# Patient Record
Sex: Female | Born: 2000 | Race: Black or African American | Hispanic: No | Marital: Single | Smoking: Never smoker
Health system: Southern US, Community
[De-identification: ages and names within clinical notes are randomized; demographics above are authoritative.]

## PROBLEM LIST (undated history)

## (undated) DIAGNOSIS — F32A Depression, unspecified: Secondary | ICD-10-CM

## (undated) DIAGNOSIS — G43909 Migraine, unspecified, not intractable, without status migrainosus: Secondary | ICD-10-CM

---
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
## 2022-02-06 IMAGING — DX DG FINGER LITTLE 2+V*R*
3 series · 3 of 3 positions shown · non-contrast
Comparison: None

CLINICAL DATA: Pain swelling RIGHT little finger, jammed finger
playing basketball today, pain at PIP joint

EXAM:
RIGHT LITTLE FINGER 2+V

[finger pa]
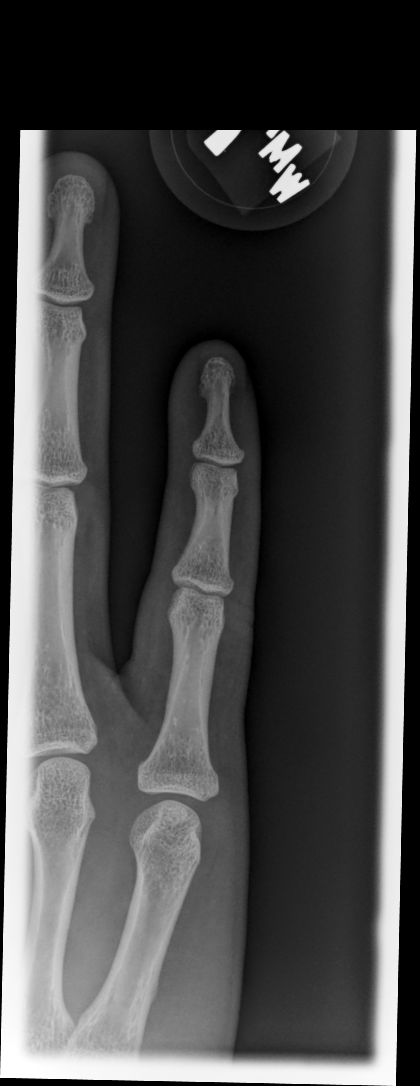

[finger mlo]
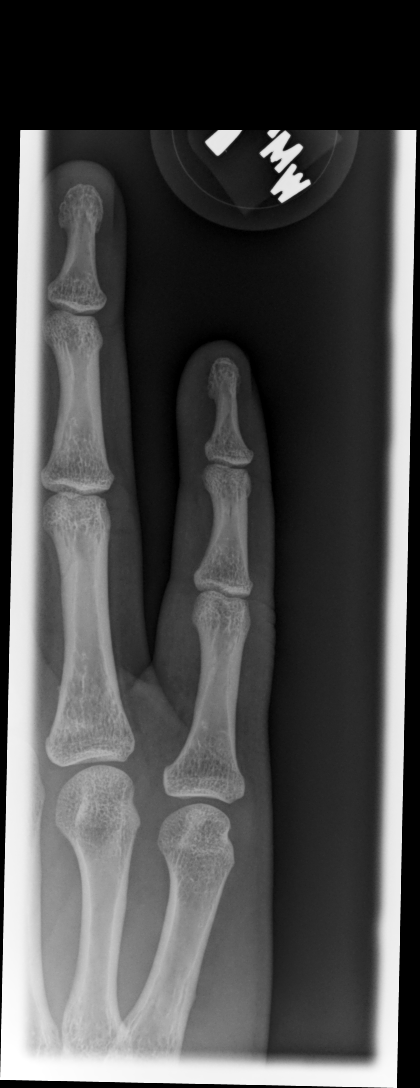

[2. finger lat]
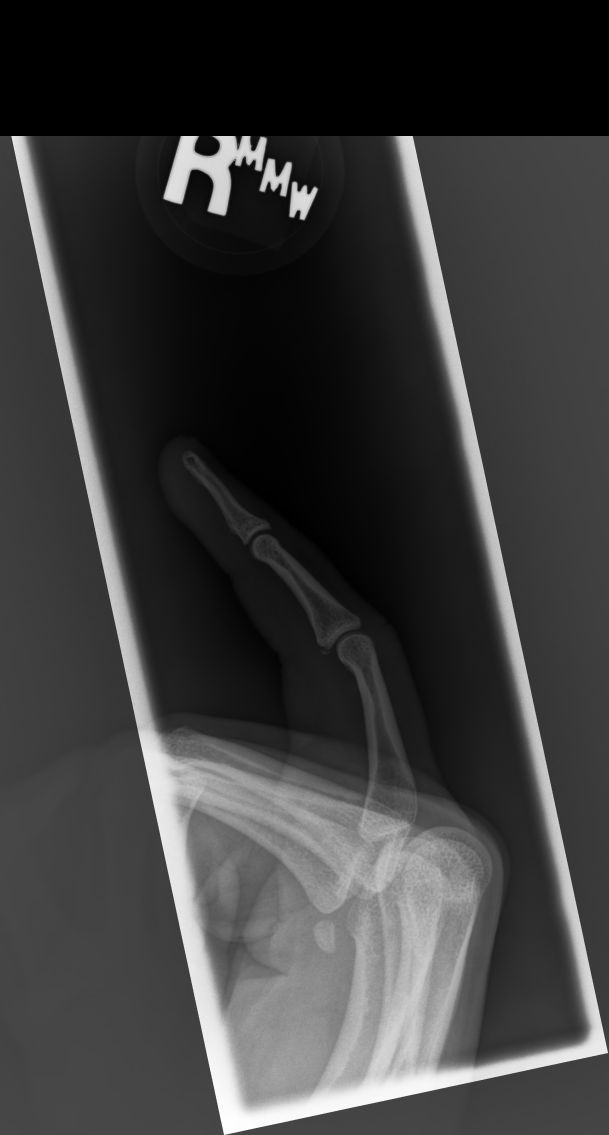

[3 of 3 positions shown; findings below may reference images not displayed]

FINDINGS: Osseous mineralization normal.

Joint spaces preserved.

Tiny volar plate avulsion fracture identified at base of middle
phalanx.

No additional fracture, dislocation, or bone destruction.
IMPRESSION: Tiny volar plate avulsion fracture at base of middle phalanx RIGHT
little finger.

## 2022-02-19 ENCOUNTER — Encounter (HOSPITAL_COMMUNITY): Payer: Self-pay

## 2022-02-19 ENCOUNTER — Other Ambulatory Visit: Payer: Self-pay

## 2022-02-19 ENCOUNTER — Emergency Department (HOSPITAL_COMMUNITY)
Admission: EM | Admit: 2022-02-19 | Discharge: 2022-02-20 | Disposition: A | Discharge status: Other Acute Inpatient Hospital | Payer: Federal, State, Local not specified - Other | Attending: Emergency Medicine | Admitting: Emergency Medicine

## 2022-02-19 DIAGNOSIS — Z20822 Contact with and (suspected) exposure to covid-19: Secondary | ICD-10-CM | POA: Insufficient documentation

## 2022-02-19 DIAGNOSIS — T1491XA Suicide attempt, initial encounter: Secondary | ICD-10-CM | POA: Diagnosis present

## 2022-02-19 DIAGNOSIS — R45851 Suicidal ideations: Secondary | ICD-10-CM | POA: Insufficient documentation

## 2022-02-19 HISTORY — DX: Depression, unspecified: F32.A

## 2022-02-19 LAB — ETHANOL: Alcohol, Ethyl (B): <10 mg/dL (ref <10)

## 2022-02-19 LAB — CBC WITH DIFFERENTIAL/PLATELET
Abs Immature Granulocytes: 0.02 10*3/uL (ref 0.00–0.07)
Basophils Absolute: 0 10*3/uL (ref 0.0–0.1)
Basophils Relative: 0 %
Eosinophils Absolute: 0.1 10*3/uL (ref 0.0–0.5)
Eosinophils Relative: 1 %
HCT: 43.6 % (ref 36.0–46.0)
Hemoglobin: 14.6 g/dL (ref 12.0–15.0)
Immature Granulocytes: 0 %
Lymphocytes Relative: 27 %
Lymphs Abs: 2.7 10*3/uL (ref 0.7–4.0)
MCH: 31.5 pg (ref 26.0–34.0)
MCHC: 33.5 g/dL (ref 30.0–36.0)
MCV: 94.2 fL (ref 80.0–100.0)
Monocytes Absolute: 0.8 10*3/uL (ref 0.1–1.0)
Monocytes Relative: 8 %
Neutro Abs: 6.4 10*3/uL (ref 1.7–7.7)
Neutrophils Relative %: 64 %
Platelets: 360 10*3/uL (ref 150–400)
RBC: 4.63 MIL/uL (ref 3.87–5.11)
RDW: 13.1 % (ref 11.5–15.5)
WBC: 10 10*3/uL (ref 4.0–10.5)
nRBC: 0 % (ref 0.0–0.2)

## 2022-02-19 LAB — COMPREHENSIVE METABOLIC PANEL
ALT: 18 U/L (ref 0–44)
AST: 19 U/L (ref 15–41)
Albumin: 4.6 g/dL (ref 3.5–5.0)
Alkaline Phosphatase: 62 U/L (ref 38–126)
Anion gap: 9 (ref 5–15)
BUN: 11 mg/dL (ref 6–20)
CO2: 25 mmol/L (ref 22–32)
Calcium: 9.5 mg/dL (ref 8.9–10.3)
Chloride: 107 mmol/L (ref 98–111)
Creatinine, Ser: 0.83 mg/dL (ref 0.44–1.00)
GFR, Estimated: >60 mL/min (ref >60)
Glucose, Bld: 85 mg/dL (ref 70–99)
Potassium: 4 mmol/L (ref 3.5–5.1)
Sodium: 141 mmol/L (ref 135–145)
Total Bilirubin: 0.3 mg/dL (ref 0.3–1.2)
Total Protein: 7.7 g/dL (ref 6.5–8.1)

## 2022-02-19 LAB — RESP PANEL BY RT-PCR (FLU A&B, COVID) ARPGX2
Influenza A by PCR: NEGATIVE
Influenza B by PCR: NEGATIVE
SARS Coronavirus 2 by RT PCR: NEGATIVE

## 2022-02-19 LAB — RAPID URINE DRUG SCREEN, HOSP PERFORMED
Amphetamines: NOT DETECTED
Barbiturates: NOT DETECTED
Benzodiazepines: NOT DETECTED
Cocaine: NOT DETECTED
Opiates: NOT DETECTED
Tetrahydrocannabinol: POSITIVE — AB

## 2022-02-19 LAB — ACETAMINOPHEN LEVEL: Acetaminophen (Tylenol), Serum: <10 ug/mL — ABNORMAL LOW (ref 10–30)

## 2022-02-19 LAB — POC URINE PREG, ED: Preg Test, Ur: NEGATIVE

## 2022-02-19 MED ORDER — ALUM & MAG HYDROXIDE-SIMETH 200-200-20 MG/5ML PO SUSP
30.0000 mL | Freq: Once | ORAL | Status: AC
  Administered 2022-02-19: 30 mL via ORAL
  Filled 2022-02-19: qty 30

## 2022-02-19 NOTE — BH Assessment (Signed)
Clinician messaged Gerlean Ren, RN: "Hey. It's Trey with TTS. Is the pt able to engage in the assessment, if so the pt will need to be placed in a private room. Also is the pt under IVC?"  ? ?Clinician awaiting response.  ? ? ?Redmond Pulling, MS, [facility] ?Triage Specialist ?[phone] ? ? ?

## 2022-02-19 NOTE — ED Notes (Signed)
Patient requesting to be a private patient.  ?

## 2022-02-19 NOTE — ED Notes (Signed)
TTS complete 

## 2022-02-19 NOTE — ED Notes (Signed)
Patient IVC paper work faxed to [employer]. Paperwork done on previous shift and held until this shift ?

## 2022-02-19 NOTE — ED Notes (Addendum)
Pt dressed out in burgundy hospital scrubs. Pt belongings removed and placed in locker. Belongings include pink shirt, black pants, socks and black shoes, brown backpack, cell phone, nose and bellybutton piercing. Pt also wanded by security. ?

## 2022-02-19 NOTE — ED Notes (Signed)
Pt allowed to call her mother at this time. ?

## 2022-02-19 NOTE — BH Assessment (Signed)
Comprehensive Clinical Assessment (CCA) Note ? ?02/19/2022 ?Amber Espinoza ?[id number] ? ?Disposition: Amber BeringShalon Bobbitt, NP recommends inpatient treatment. Hassie BruceKim, AC, RN [facility], if no available beds disposition CSW to seek placement. Disposition discussed with Amber RenAngela Coe, RN. ? ?Flowsheet Row ED [facility]/19/[facility]  ?C-SSRS RISK CATEGORY High Risk No Risk No Risk  ? ?  ? ?The patient demonstrates the following risk factors for suicide: Chronic risk factors for suicide include: psychiatric disorder of Major Depressive Disorder, recurrent, severe with psychotic features and previous suicide attempts Pt reports, today was her first suicide attempt . Acute risk factors for suicide include:  Depression . Protective factors for this patient include:  None . Considering these factors, the overall suicide risk at this point appears to be high. Patient is not appropriate for outpatient follow up. ? ?Amber Espinoza is a 21 year old female who presents voluntary and unaccompanied to [facility]. Clinician asked the pt, "what brought you to the hospital?" Pt reports, she took too many pills (40 Ibuprofen, 250 mg tablets) around 0300-0400 Friday morning. Pt admits taking the medications was a suicide attempt. Pt reports, he's stressed due to depression which triggered her suicide attempt. Pt initially denies AVH while giggling then expressed she's been hearing voices for as long as she can remember. Per pt, the voices in her head are funny and help her calm down with no commands. Pt denies, (while giggling) SI, and HI. Pt also denies, self-injurious behaviors and access to weapons. ? ?Pt denies, substance use however pt's UDS is positive for Marijuana. Pt denies, being linked to OPT resources (medication management and/or counseling.) Pt denies, previous inpatient admissions.  ? ?Pt presents alert  with normal speech. During the assessment, pt giggled at the initial questions. Pt's mood was hopeless, pleasant. Pt's affect was congruent. Pt's insight was fair. Pt's judgement was impulsive.  ? ?Diagnosis: Major Depressive Disorder, recurrent, severe with psychotic features.  ? ?*Clinician attempted to contact pt's mother/guardian Amber Espinoza(Laquieta Brooks, [phone]) to gather additional information. At 2054, clinician left HIPPA compliant voice message with call back information.* ? ?Chief Complaint:  ?Chief Complaint  ?Patient presents with  ? Suicide Attempt  ? ?Visit Diagnosis:   ? ? ?CCA Screening, Triage and Referral (STR) ? ?Patient Reported Information ?How did you hear about us? Self ? ?What Is the Reason for Your Visit/Call Today? Per EDP/PA note: "Patient is an 21 year old female presenting today due to suicide attempt. She states at 3 AM roughly 12 hours ago she took 40 pills of 200 mg Motrin and attempt to end her life. She states she has been struggling with depression for multiple years, denies any obvious trigger but states last night she felt like "there was no point anymore". 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 No family history of mental illness. Denies any alcohol or drug use. She endorses abdominal pain and diarrhea. She had a few episodes of emesis earlier today." ? ?How Long Has This Been Causing You Problems? <Week ? ?What Do You Feel Would Help You the Most Today? Treatment for Depression or other mood problem; Medication(s) ? ? ?Have You Recently Had Any Thoughts About Hurting Yourself? Yes ? ?Are You Planning to Commit Suicide/Harm Yourself At This time? Yes ? ? ?Have you Recently Had Thoughts About Hurting Someone Karolee Ohslse? No ? ?Are You  Planning to Harm Someone at This Time? No ? ?Explanation: No data recorded ? ?Have You Used Any Alcohol or Drugs in the Past 24 Hours? -- (Pt denies however pt's UDS is positive for Marijuana.) ? ?How Long Ago Did You  Use Drugs or Alcohol? No data recorded ?What Did You Use and How Much? No data recorded ? ?Do You Currently Have a Therapist/Psychiatrist? No data recorded ?Name of Therapist/Psychiatrist: No data recorded ? ?Have You Been Recently Discharged From Any Office Practice or Programs? No data recorded ?Explanation of Discharge From Practice/Program: No data recorded ? ?  ?CCA Screening Triage Referral Assessment ?Type of Contact: Tele-Assessment ? ?Telemedicine Service Delivery: Telemedicine service delivery: This service was provided via telemedicine using a 2-way, interactive audio and video technology ? ?Is this Initial or Reassessment? Initial Assessment ? ?Date Telepsych consult ordered in [facility]:  02/19/22 ? ?Time Telepsych consult ordered in [facility]:  1741 ? ?Location of Assessment: [facility] ? ?Provider Location: [facility] Assessment Services ? ? ?Collateral Involvement: Clinician attempted to contact pt's mother/guardian Amber Espinoza, [phone]) to gather additional information. ? ? ?Does Patient Have a Automotive engineer Guardian? No data recorded ?Name and Contact of Legal Guardian: No data recorded ?If Minor and Not Living with Parent(s), Who has Custody? No data recorded ?Is CPS involved or ever been involved? No data recorded ?Is APS involved or ever been involved? No data recorded ? ?Patient Determined To Be At Risk for Harm To Self or Others Based on Review of Patient Reported Information or Presenting Complaint? Yes, for Self-Harm ? ?Method: No data recorded ?Availability of Means: No data recorded ?Intent: No data recorded ?Notification Required: No data recorded ?Additional Information for Danger to Others Potential: No data recorded ?Additional Comments for Danger to Others Potential: No data recorded ?Are There Guns or Other Weapons in Your Home? No data recorded ?Types of Guns/Weapons: No data recorded ?Are These Weapons Safely Secured?                            No data recorded ?Who Could Verify You  Are Able To Have These Secured: No data recorded ?Do You Have any Outstanding Charges, Pending Court Dates, Parole/Probation? No data recorded ?Contacted To Inform of Risk of Harm To Self or Others: No data recorded ? ? ?Does Patient Present under Involuntary Commitment? No ? ?IVC Papers Initial File Date: No data recorded ? ?[place] of Residence: [place] ? ? ?Patient Currently Receiving the Following Services: Not Receiving Services ? ? ?Determination of Need: Emergent (2 hours) ? ? ?Options For Referral: Medication Management; Outpatient Therapy; Inpatient Hospitalization ? ? ? ? ?CCA Biopsychosocial ?Patient Reported Schizophrenia/Schizoaffective Diagnosis in Past: No data recorded ? ?Strengths: No data recorded ? ?Mental Health Symptoms ?Depression:   ?Irritability; Hopelessness; Fatigue; Sleep (too much or little) ?  ?Duration of Depressive symptoms:    ?Mania:  No data recorded  ?Anxiety:    ?Tension; Worrying ?  ?Psychosis:   ?Hallucinations ?  ?Duration of Psychotic symptoms:  ?Duration of Psychotic Symptoms: Greater than six months (Pt reports, for as long as she can remember.) ?  ?Trauma:   ?None ?  ?Obsessions:   ?None ?  ?Compulsions:   ?None ?  ?Inattention:   ?Disorganized; Loses things; Forgetful ?  ?Hyperactivity/Impulsivity:   ?None ?  ?Oppositional/Defiant Behaviors:   ?None ?  ?Emotional Irregularity:   ?Recurrent suicidal behaviors/gestures/threats; Potentially harmful impulsivity ?  ?Other Mood/Personality Symptoms:  No data recorded  ? ?Mental Status Exam ?Appearance and self-care  ?Stature:   ?Average ?  ?Weight:   ?Average weight ?  ?Clothing:  No data recorded  ?Grooming:   ?Normal ?  ?Cosmetic use:   ?None ?  ?Posture/gait:   ?Normal ?  ?Motor activity:   ?Not Remarkable ?  ?Sensorium  ?Attention:   ?Normal ?  ?Concentration:   ?Normal ?  ?Orientation:  No data recorded  ?Recall/memory:   ?Normal ?  ?Affect and Mood  ?Affect:  No data recorded  ?Mood:  No data recorded  ?Relating  ?Eye  contact:   ?Normal ?  ?Facial expression:   ?Responsive ?  ?Attitude toward examiner:   ?Silly; Cooperative ?  ?Thought and Language  ?Speech flow:  ?Normal ?  ?Thought content:   ?Appropriate to Mood

## 2022-02-19 NOTE — ED Notes (Signed)
Poison Control called for update, given tylenol level. Poison Control signed off at this time.  ?

## 2022-02-19 NOTE — ED Notes (Signed)
Pt unable to provide urine sample at this time 

## 2022-02-19 NOTE — ED Triage Notes (Signed)
Patient reports she took approx 40 motrin around 0300 attempting to hurt herself.  Patient reports she gets depressed a lot and has thought of many other ways of hurting herself.  Patient brought herself in because she said her body didn't feel right. ?

## 2022-02-19 NOTE — ED Provider Notes (Signed)
?[place] ?Provider Note ? ? ?CSN: [id number] ?Arrival date & time: 02/19/22  1435 ? ?  ? ?History ? ?Chief Complaint  ?Patient presents with  ? Suicide Attempt  ? ? ?Amber Espinoza is a 21 y.o. female. ? ?HPI ? ?Patient is an 21 year old female presenting today due to suicide attempt.  She states at 3 AM roughly 12 hours ago she took 40 pills of 200 mg Motrin and attempt to end her life.  She states she has been struggling with depression for multiple years, denies any obvious trigger but states last night she felt like "there was no point anymore".  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  No family history of mental illness.  Denies any alcohol or drug use.  She endorses abdominal pain and diarrhea.  She had a few episodes of emesis earlier today. ? ?Home Medications ?Prior to Admission medications   ?Medication Sig Start Date End Date Taking? Authorizing Provider  ?ibuprofen (ADVIL) 400 MG tablet Take 1 tablet (400 mg total) by mouth every 6 (six) hours as needed. 02/20/21   LampteyMyrene Galas, MD  ?metroNIDAZOLE (FLAGYL) 500 MG tablet Take 1 tablet (500 mg total) by mouth 2 (two) times daily. 08/15/21   Scot Jun, FNP  ?oseltamivir (TAMIFLU) 75 MG capsule Take 1 capsule (75 mg total) by mouth every 12 (twelve) hours. 10/07/21   Volney American, PA-C  ?promethazine-dextromethorphan (PROMETHAZINE-DM) 6.25-15 MG/5ML syrup Take 5 mLs by mouth 4 (four) times daily as needed for cough. 10/07/21   Volney American, PA-C  ?   ? ?Allergies    ?Patient has no known allergies.   ? ?Review of Systems   ?Review of Systems ? ?Physical Exam ?Updated Vital Signs ?BP 120/84 (BP Location: Right Arm)   Pulse 80   Temp 98.1 ?F (36.7 ?C) (Oral)   Resp 19   Ht 5\' 4"  (1.626 m)   Wt 52.7 kg   LMP 01/29/2022   SpO2 100%   BMI 19.95 kg/m?  ?Physical Exam ?Vitals and nursing note reviewed. Exam conducted with a chaperone present.  ?Constitutional:   ?    Appearance: Normal appearance.  ?HENT:  ?   Head: Normocephalic and atraumatic.  ?Eyes:  ?   General: No scleral icterus.    ?   Right eye: No discharge.     ?   Left eye: No discharge.  ?   Extraocular Movements: Extraocular movements intact.  ?   Pupils: Pupils are equal, round, and reactive to light.  ?Cardiovascular:  ?   Rate and Rhythm: Normal rate and regular rhythm.  ?   Pulses: Normal pulses.  ?   Heart sounds: Normal heart sounds. No murmur heard. ?  No friction rub. No gallop.  ?Pulmonary:  ?   Effort: Pulmonary effort is normal. No respiratory distress.  ?   Breath sounds: Normal breath sounds.  ?Abdominal:  ?   General: Abdomen is flat. Bowel sounds are normal. There is no distension.  ?   Palpations: Abdomen is soft.  ?   Tenderness: There is no abdominal tenderness.  ?   Comments: Soft abdomen  ?Skin: ?   General: Skin is warm and dry.  ?   Coloration: Skin is not jaundiced.  ?Neurological:  ?   Mental Status: She is alert. Mental status is at baseline.  ?   Coordination: Coordination normal.  ?Psychiatric:  ?   Comments: Flat affect.  ? ? ?  ED Results / Procedures / Treatments   ?Labs ?(all labs ordered are listed, but only abnormal results are displayed) ?Labs Reviewed  ?RESP PANEL BY RT-PCR (FLU A&B, COVID) ARPGX2  ?COMPREHENSIVE METABOLIC PANEL  ?ETHANOL  ?CBC WITH DIFFERENTIAL/PLATELET  ?RAPID URINE DRUG SCREEN, HOSP PERFORMED  ?ACETAMINOPHEN LEVEL  ?POC URINE PREG, ED  ? ? ?EKG ?EKG Interpretation ? ?Date/Time:  Friday February 19 2022 15:39:10 EDT ?Ventricular Rate:  74 ?PR Interval:  104 ?QRS Duration: 86 ?QT Interval:  394 ?QTC Calculation: 437 ?R Axis:   67 ?Text Interpretation: Sinus rhythm with short PR Otherwise normal ECG No previous ECGs available Confirmed by Daleen Bo [phone]) on 02/19/2022 3:45:09 PM ? ?Radiology ?No results found. ? ?Procedures ?Procedures  ? ? ?Medications Ordered in ED ?Medications  ?alum & mag hydroxide-simeth (MAALOX/MYLANTA) 200-200-20 MG/5ML suspension 30 mL  (30 mLs Oral Given 02/19/22 1526)  ? ? ?ED Course/ Medical Decision Making/ A&P ?  ?                        ?Medical Decision Making ?Amount and/or Complexity of Data Reviewed ?Labs: ordered. ? ?Risk ?OTC drugs. ? ? ?This patient presents to the ED for concern of suicide attempt, this involves an extensive number of treatment options, and is a complaint that carries with it a high risk of complications and morbidity.  The differential diagnosis includes toxicity from salicylate overdose, GI upset, other ? ? ?Additional history obtained:  ? ?I reviewed past medical history, patient does not have a documented history of psychiatric issues.   ?  ?Lab Tests: ? ?I ordered, viewed, and personally interpreted labs.  The pertinent results include:   ?CBC without any leukocytosis or anemia.  No gross electrolyte derangement or AKI noted.  LFTs within normal limits. ? ?ECG/Cardiac monitoring:  ? ?Per my interpretation, EKG shows short PR. ? ? ?Medicines ordered and prescription drug management: ? ?I ordered medication including: Maalox ? ?I have reviewed the patients home medicines and have made adjustments as needed ? ? ?Test Considered: ?Considered IVC but patient is calm and cooperative at this time it is not require IVC.  If that were to change I do think patient needs to be seen by psychiatry prior to leaving. ? ?  ?Consultations Obtained: ? ?I requested consultation with the poison control and TTS.  Discussed lab and imaging findings as well as pertinent plan - they recommend:  ?-Poison control recommends monitoring, treating the GI upset and checking Tylenol as well as EKG. ?-TTS consultation pending. ? ?Reevaluation: ? ?After the interventions noted above, I reevaluated the patient and found patient remains calm and cooperative although her affect is flat. ? ? ?Problems addressed / ED Course: ?21 year old female presenting today due to suicide attempt.  She is having some GI upset but denies any other symptoms.  She is  hemodynamically stable, QT within normal limits PR interval is shortened.  There is no gross electrolyte derangement, renal impairment or LFT derangement.  At this time I feel she is medically cleared and appropriate for TTS evaluation.  She is here voluntarily. ?  ?Social Determinants of Health: ?She is 20 but still high school senior. ?  ?Disposition: ? ? ?After consideration of the diagnostic results and the patients response to treatment, I feel that the patent would benefit from TTS evaluation. ? ?  ? ? ? ? ? ? ?Final Clinical Impression(s) / ED Diagnoses ?Final diagnoses:  ?None  ? ? ?Rx /  DC Orders ?ED Discharge Orders   ? ? None  ? ?  ? ? ?  ?Sherrill Raring, PA-C ?02/19/22 1556 ? ?  ?Daleen Bo, MD ?02/19/22 1636 ? ?

## 2022-02-20 ENCOUNTER — Inpatient Hospital Stay (HOSPITAL_COMMUNITY)
Admission: AD | Admit: 2022-02-20 | Discharge: 2022-02-25 | DRG: 885 | Disposition: A | Discharge status: Home / Self Care | Payer: Federal, State, Local not specified - Other | Source: Intra-hospital | Attending: Psychiatry | Admitting: Psychiatry

## 2022-02-20 ENCOUNTER — Other Ambulatory Visit: Payer: Self-pay | Admitting: Psychiatry

## 2022-02-20 DIAGNOSIS — Z87891 Personal history of nicotine dependence: Secondary | ICD-10-CM

## 2022-02-20 DIAGNOSIS — Z9151 Personal history of suicidal behavior: Secondary | ICD-10-CM | POA: Diagnosis not present

## 2022-02-20 DIAGNOSIS — Z20822 Contact with and (suspected) exposure to covid-19: Secondary | ICD-10-CM | POA: Diagnosis present

## 2022-02-20 DIAGNOSIS — Z62810 Personal history of physical and sexual abuse in childhood: Secondary | ICD-10-CM | POA: Diagnosis present

## 2022-02-20 DIAGNOSIS — Z62811 Personal history of psychological abuse in childhood: Secondary | ICD-10-CM | POA: Diagnosis present

## 2022-02-20 DIAGNOSIS — F32A Depression, unspecified: Secondary | ICD-10-CM | POA: Diagnosis present

## 2022-02-20 DIAGNOSIS — F332 Major depressive disorder, recurrent severe without psychotic features: Secondary | ICD-10-CM | POA: Diagnosis present

## 2022-02-20 DIAGNOSIS — F322 Major depressive disorder, single episode, severe without psychotic features: Secondary | ICD-10-CM | POA: Diagnosis present

## 2022-02-20 DIAGNOSIS — T1491XA Suicide attempt, initial encounter: Secondary | ICD-10-CM

## 2022-02-20 DIAGNOSIS — Z818 Family history of other mental and behavioral disorders: Secondary | ICD-10-CM | POA: Diagnosis not present

## 2022-02-20 DIAGNOSIS — F410 Panic disorder [episodic paroxysmal anxiety] without agoraphobia: Secondary | ICD-10-CM | POA: Diagnosis present

## 2022-02-20 DIAGNOSIS — F515 Nightmare disorder: Secondary | ICD-10-CM | POA: Diagnosis present

## 2022-02-20 DIAGNOSIS — G4700 Insomnia, unspecified: Secondary | ICD-10-CM | POA: Diagnosis present

## 2022-02-20 DIAGNOSIS — Z23 Encounter for immunization: Secondary | ICD-10-CM | POA: Diagnosis not present

## 2022-02-20 DIAGNOSIS — F329 Major depressive disorder, single episode, unspecified: Secondary | ICD-10-CM | POA: Diagnosis present

## 2022-02-20 DIAGNOSIS — F4312 Post-traumatic stress disorder, chronic: Secondary | ICD-10-CM | POA: Diagnosis present

## 2022-02-20 HISTORY — DX: Migraine, unspecified, not intractable, without status migrainosus: G43.909

## 2022-02-20 NOTE — ED Notes (Signed)
Attempted to inform Legal guardian of pt admission to [facility]. Multiple phone calls made and voice mail reached each time. ?

## 2022-02-20 NOTE — Progress Notes (Signed)
Heather with [facility] advised that the patient was declined due to no current appropriate beds available at this time. ? ?Glennie Isle, MSW, LCSW-A, LCAS-A ?Phone: [phone] ?Disposition/TOC ? ?

## 2022-02-20 NOTE — ED Provider Notes (Signed)
Emergency Medicine Observation Re-evaluation Note ? ?Amber Espinoza is a 21 y.o. female, seen on rounds today.  Pt initially presented to the ED for complaints of suicidal thoughts and took overdose of ibuprofen. Denies abd pain. No nv.  ? ?Physical Exam  ?BP 108/81 (BP Location: Right Arm)   Pulse 67   Temp 98.8 ?F (37.1 ?C) (Oral)   Resp 16   Ht 1.626 m (5\' 4" )   Wt 52.7 kg   LMP 01/29/2022   SpO2 100%   BMI 19.95 kg/m?  ?Physical Exam ?General: alert, content, no distress. ?Cardiac: regular rate. ?Lungs: breathing comfortably. ?Psych: alert, content, normal mood/affect. Pt does not appear to be responding to internal stimuli.  ? ?ED Course / MDM  ? ? ?I have reviewed the labs performed to date as well as medications administered while in observation.  Recent changes in the last 24 hours include ED obs, BH reassessment.  ? ?Plan  ? ?03/31/2022 is under involuntary commitment. ?  ?BH reassessment and inpatient psych placement is pending. Disposition per [facility] team.  ?  ?[facility], MD ?02/20/22 02/22/22 ? ?

## 2022-02-20 NOTE — ED Notes (Signed)
Attempted to call legal guardian to inform them of pt admission to [facility]. ?
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
## 2022-02-20 NOTE — Progress Notes (Signed)
Inpatient Behavioral Health Placement  ? ?Pt meets inpatient criteria per Roselyn Bering, NP. There are no appropriate beds at [facility] per [facility] kim Shon Baton, RN.  Referral was sent to the following facilities;  ? ?Destination ?Service Provider Address Phone Fax  ?[facility]  [address], [place] [address] [phone]  ?[facility]  [address] [place] [address] [phone]  ?[facility]  [address] [place], [place] [address] [phone]  ?[facility] [place] [address] [phone]  ?[facility]  [address] [place], [place] [address] [phone]  ?[facility]  [address] [place] [address] [phone]  ?[facility]  [address] [place] [address] [phone]  ?[facility]  [address] [place] [address] [phone]  ?[facility]  [address] [place] [address] [phone]  ?[facility]  [address] [place]., [place] [address] [phone]  ?[facility]  [address] [place] [address] [phone]  ?[facility]  [address], [place] [address] [phone]  ?[facility]  [address] [place], [place] [address] [phone]  ?[facility]  [address] [place], [place] [address] [phone]  ?[facility]  [address] [place] [address] [phone]  ? ? ? ?Situation ongoing,  CSW will follow up. ? ? ?Maryjean Ka, MSW, LCSWA ?02/20/2022  @ 12:51 AM ? ?

## 2022-02-20 NOTE — ED Notes (Signed)
Pt sleeping at this time. Rise and fall of chest is noted, respirations even and unlabored.  ?Will assess vital signs when pt wakes up to prevent agitating this pt. Sitter at bedside.  ?

## 2022-02-20 NOTE — ED Notes (Addendum)
Pt ambulated to restroom. Pt given tooth brush, tooth paste, deodorant, soap, lotion, and wash cloths to perform hygiene. Collected and placed at sitter station after use. Pt given phone at this time. Pt has been calm and cooperative. ?

## 2022-02-20 NOTE — ED Notes (Signed)
TTS in progress 

## 2022-02-21 ENCOUNTER — Other Ambulatory Visit: Payer: Self-pay

## 2022-02-21 ENCOUNTER — Encounter (HOSPITAL_COMMUNITY): Payer: Self-pay | Admitting: Psychiatry

## 2022-02-21 DIAGNOSIS — F322 Major depressive disorder, single episode, severe without psychotic features: Secondary | ICD-10-CM | POA: Diagnosis present

## 2022-02-21 DIAGNOSIS — F4312 Post-traumatic stress disorder, chronic: Principal | ICD-10-CM | POA: Diagnosis present

## 2022-02-21 DIAGNOSIS — F332 Major depressive disorder, recurrent severe without psychotic features: Secondary | ICD-10-CM | POA: Diagnosis present

## 2022-02-21 MED ORDER — HYDROXYZINE HCL 25 MG PO TABS
25.0000 mg | ORAL_TABLET | Freq: Every evening | ORAL | Status: DC | PRN
Start: 2022-02-21 — End: 2022-02-25
  Administered 2022-02-21 – 2022-02-24 (×4): 25 mg via ORAL
  Filled 2022-02-21 (×4): qty 1

## 2022-02-21 MED ORDER — MAGNESIUM HYDROXIDE 400 MG/5ML PO SUSP: 15.0000 mL | Freq: Every evening | ORAL | Status: DC | PRN

## 2022-02-21 MED ORDER — MELATONIN 5 MG PO TABS
5.0000 mg | ORAL_TABLET | Freq: Every day | ORAL | Status: DC
Start: 2022-02-21 — End: 2022-02-25
  Administered 2022-02-21 – 2022-02-24 (×4): 5 mg via ORAL
  Filled 2022-02-21 (×9): qty 1

## 2022-02-21 MED ORDER — SERTRALINE HCL 25 MG PO TABS
25.0000 mg | ORAL_TABLET | Freq: Every day | ORAL | Status: DC
  Administered 2022-02-21 – 2022-02-24 (×4): 25 mg via ORAL
  Filled 2022-02-21 (×6): qty 1

## 2022-02-21 MED ORDER — ACETAMINOPHEN 325 MG PO TABS: 650.0000 mg | ORAL_TABLET | Freq: Four times a day (QID) | ORAL | Status: DC | PRN

## 2022-02-21 MED ORDER — ALUM & MAG HYDROXIDE-SIMETH 200-200-20 MG/5ML PO SUSP: 30.0000 mL | Freq: Four times a day (QID) | ORAL | Status: DC | PRN

## 2022-02-21 NOTE — BHH Counselor (Signed)
CSW NOTE:  ? ?CSW attempted to contact mother, Lake Bells, [phone] for PSA information. CSW left a HIPPA compliant voicemail. CSW will attempt again at a later time. ? ?Read Drivers, MSW, LCSW-A  ?02/21/2022 9:30am  ?
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
## 2022-02-21 NOTE — BHH Group Notes (Signed)
BHH Group Notes:  (Nursing/MHT/Case Management/Adjunct) ? ?Date:  02/21/2022  ?Time:  1:53 PM ? ?Type of Therapy:  Group Therapy ? ?Summary of Progress/Problems: ? ?Goals group was not facilitated due to unit restrictions. Patients were asked to complete daily self inventory sheets and packets in their rooms.  ? ?Amber Espinoza ?02/21/2022, 1:53 PM ?
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
## 2022-02-21 NOTE — Progress Notes (Signed)
Pt oriented to unit rules and procedures. Pt observed laying in bed with eyes awake. Pt denies SI/HI/AVH. Pt appears anxious but pleasant on approach. No additional concerns. Pt remains safe.   ?
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
## 2022-02-21 NOTE — Group Note (Signed)
LCSW Group Therapy ? ? ?CSW group not facilitated due to unit limitations on patient proximity/interactions due to infection prevention measures. ? ?Amber Espinoza Amber Espinoza LCSWA  ?2:49 PM  ?
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
## 2022-02-22 ENCOUNTER — Encounter (HOSPITAL_COMMUNITY): Payer: Self-pay

## 2022-02-22 NOTE — Progress Notes (Signed)
Recreation Therapy Notes ? ?INPATIENT RECREATION THERAPY ASSESSMENT ? ?Patient Details ?Name: Amber Espinoza ?MRN: [id number] ?DOB: [birth year] ?Today's Date: 02/22/2022 ?      ?Information Obtained From: ?Patient (In additiont to Treatment Team Meeting) ? ?Able to Participate in Assessment/Interview: ?Yes ? ?Patient Presentation: ?Alert ? ?Reason for Admission (Per Patient): ?Suicide Attempt ("I brought myself in because I took 40 Ibuprofen.") ? ?Patient Stressors: ?School, Family, Other (Comment) ("Grades being due and graduation is right around the corner; Home- I don't usually get told no and my mom said she wouldn't take me to get water at 2 in the morning; Past things") ? ?Coping Skills:   ?Isolation, Avoidance, Arguments, Impulsivity, Substance Abuse, Hot Bath/Shower, Music, Talk ("Talk to my boyfriend" Pt endorses weekly marijuana use.) ? ?Leisure Interests (2+):  ?Social - Friends, Social - Family, Individual - Phone, Individual - Other (Comment) ("Cooking; Making TikToks with my little sister; Spending time with my boyfriend") ? ?Frequency of Recreation/Participation: ? (Daily) ? ?Awareness of Community Resources:  ?Yes ? ?Community Resources:  ?Public affairs consultant, Tree surgeon ? ?Current Use: ?Yes ? ?If no, Barriers?: ? (N/A) ? ?Expressed Interest in [employer] Information: ?No ? ?[place] of Residence:  ?Aaron Edelman (12th gr, [employer]) ? ?Patient Main Form of Transportation: ?Car ? ?Patient Strengths:  ?"I'm a speaker, like I can talk to people; I bring positivity in the room." ? ?Patient Identified Areas of Improvement:  ?"Depression; Cluttered mind" ? ?Patient Goal for Hospitalization:  ?"Decreasing stress" ? ?Current SI (including self-harm):  ?No ? ?Current HI:  ?No ? ?Current AVH: ?No ? ?Staff Intervention Plan: ?Group Attendance, Collaborate with Interdisciplinary Treatment Team ? ?Consent to Intern Participation: ?N/A ? ? ?Ilsa Iha, LRT, CTRS ?Benito Mccreedy Sohail Capraro ?02/22/2022, 4:20 PM ? ?

## 2022-02-22 NOTE — [facility] Group Notes (Signed)
Child/Adolescent Psychoeducational Group Note ? ?Date:  02/22/2022 ?Time:  12:10 PM ? ?Group Topic/Focus:  Goals Group:   The focus of this group is to help patients establish daily goals to achieve during treatment and discuss how the patient can incorporate goal setting into their daily lives to aide in recovery. ? ?Participation Level:  Active ? ?Participation Quality:  Appropriate ? ?Affect:  Appropriate ? ?Cognitive:  Appropriate ? ?Insight:  Appropriate ? ?Engagement in Group:  Engaged ? ?Modes of Intervention:  Education ? ?Additional Comments:  Pt  goal today is to think positive.Pt has no feelings of wanting to hurt herself or others. ? ?Amber Espinoza, Sharen Counter ?02/22/2022, 12:10 PM ?

## 2022-02-22 NOTE — Group Note (Signed)
LCSW Group Therapy Note ? ? ?Group Date: 02/22/2022 ?Start Time: 1430 ?End Time: [id number] ?  ?  ?Type of Therapy and Topic:  Group Therapy:  ?  ?CSW group not facilitated due to unit limitations on patient proximity/interactions due to infection prevention measures. ?  ? ?Carie Caddy, LCSWA ?02/22/2022  2:54 PM   ? ?

## 2022-02-22 NOTE — Progress Notes (Signed)
[facility] MD Progress Note ? ?02/22/2022 9:08 AM ?Amber Espinoza  ?MRN:  [id number] ? ?Subjective:  "I am depressed and stressed about several things and not sleeping more than 2 hours at night." ? ?On evaluation the patient reported: Patient was seen during the treatment team meeting and patient reported goals for the hospitalization is working on her stress to reduce, depression and feeling like a cluttered brain and denies current suicidal ideation reportedly compliant with medication without adverse effects.  Patient reported her appetite has been okay since she came to the hospital.  Patient appeared calm, cooperative and pleasant.  Patient is also awake, alert oriented to time place person and situation.  Patient has decreased psychomotor activity, good eye contact and normal rate rhythm and volume of speech.  Patient has been actively participating in therapeutic milieu, group activities and learning coping skills to control emotional difficulties including depression and anxiety.  Patient rated depression-5/10, anxiety-3/10, anger-0/10, 10 being the highest severity.   Patient contract for safety while being in hospital and minimized current safety issues.  Patient has been taking medication, tolerating well without side effects of the medication including GI upset or mood activation.   ? ?Principal Problem: Chronic post-traumatic stress disorder (PTSD) ?Diagnosis: Principal Problem: ?  Chronic post-traumatic stress disorder (PTSD) ?Active Problems: ?  Suicide attempt [facility]) ?  MDD (major depressive disorder), recurrent severe, without psychosis (HCC) ? ?Total Time spent with patient: 30 minutes ? ?Past Psychiatric History: Depression and anxiety. ? ?Past Medical History:  ?Past Medical History:  ?Diagnosis Date  ? Depression   ? Migraines   ? History reviewed. No pertinent surgical history. ?Family History: History reviewed. No pertinent family history. ?Family Psychiatric  History: Depression in both parents. ?Social  History:  ?Social History  ? ?Substance and Sexual Activity  ?Alcohol Use Yes  ? Comment: occasionally at parties with her friends, 1 small shot  ?   ?Social History  ? ?Substance and Sexual Activity  ?Drug Use Yes  ? Types: Marijuana  ? Comment: 2 blunts every other day, last use: Thursday  ?  ?Social History  ? ?Socioeconomic History  ? Marital status: Single  ?  Spouse name: Not on file  ? Number of children: Not on file  ? Years of education: Not on file  ? Highest education level: Not on file  ?Occupational History  ? Not on file  ?Tobacco Use  ? Smoking status: Never  ? Smokeless tobacco: Never  ?Vaping Use  ? Vaping Use: Never used  ?Substance and Sexual Activity  ? Alcohol use: Yes  ?  Comment: occasionally at parties with her friends, 1 small shot  ? Drug use: Yes  ?  Types: Marijuana  ?  Comment: 2 blunts every other day, last use: Thursday  ? Sexual activity: Yes  ?  Birth control/protection: None, Other-see comments  ?  Comment: pt said that sometimes she'll use protection  ?Other Topics Concern  ? Not on file  ?Social History Narrative  ? Senior at [employer]. She has been dating her boyfriend for 2 months. She stays in [place], [place] with her mother, mother's boyfriend, and her 4 siblings. Pt was supposed to start a job at [employer].  ? ?Social Determinants of Health  ? ?Financial Resource Strain: Not on file  ?Food Insecurity: Not on file  ?Transportation Needs: Not on file  ?Physical Activity: Not on file  ?Stress: Not on file  ?Social Connections: Not on file  ? ?Additional  Social History:  ?  ?  ?  ?  ?  ?  ?  ?  ?  ?  ?  ? ?Sleep: Fair ? ?Appetite:  Fair ? ?Current Medications: ?Current Facility-Administered Medications  ?Medication Dose Route Frequency Provider Last Rate Last Admin  ? acetaminophen (TYLENOL) tablet 650 mg  650 mg Oral Q6H PRN Ajibola, Ene A, NP      ? alum & mag hydroxide-simeth (MAALOX/MYLANTA) 200-200-20 MG/5ML suspension 30 mL  30 mL Oral Q6H PRN Ajibola,  Ene A, NP      ? hydrOXYzine (ATARAX) tablet 25 mg  25 mg Oral QHS PRN,MR X 1 Leata MouseJonnalagadda, Alinna Siple, MD   25 mg at 02/21/22 2000  ? magnesium hydroxide (MILK OF MAGNESIA) suspension 15 mL  15 mL Oral QHS PRN Ajibola, Ene A, NP      ? melatonin tablet 5 mg  5 mg Oral QHS Leata MouseJonnalagadda, Gorden Stthomas, MD   5 mg at 02/21/22 2000  ? sertraline (ZOLOFT) tablet 25 mg  25 mg Oral Daily Leata MouseJonnalagadda, Makell Drohan, MD   25 mg at 02/22/22 [id number]  ? ? ?Lab Results: No results found for this or any previous visit (from the past 48 hour(s)). ? ?Blood Alcohol level:  ?Lab Results  ?Component Value Date  ? ETH <10 02/19/2022  ? ? ?Metabolic Disorder Labs: ?No results found for: HGBA1C, MPG ?No results found for: PROLACTIN ?No results found for: CHOL, TRIG, HDL, CHOLHDL, VLDL, LDLCALC ? ?Physical Findings: ?AIMS: Facial and Oral Movements ?Muscles of Facial Expression: None, normal ?Lips and Perioral Area: None, normal ?Jaw: None, normal ?Tongue: None, normal,Extremity Movements ?Upper (arms, wrists, hands, fingers): None, normal ?Lower (legs, knees, ankles, toes): None, normal, Trunk Movements ?Neck, shoulders, hips: None, normal, Overall Severity ?Severity of abnormal movements (highest score from questions above): None, normal ?Incapacitation due to abnormal movements: None, normal ?Patient's awareness of abnormal movements (rate only patient's report): No Awareness, Dental Status ?Current problems with teeth and/or dentures?: No ?Does patient usually wear dentures?: No  ?CIWA:    ?COWS:    ? ?Musculoskeletal: ?Strength & Muscle Tone: within normal limits ?Gait & Station: normal ?Patient leans: N/A ? ?Psychiatric Specialty Exam: ? ?Presentation  ?General Appearance: Appropriate for Environment; Casual ? ?Eye Contact:Fair ? ?Speech:Clear and Coherent ? ?Speech Volume:Decreased ? ?Handedness:Right ? ? ?Mood and Affect  ?Mood:Depressed; Anxious ? ?Affect:Depressed; Constricted ? ? ?Thought Process  ?Thought Processes:Coherent; Goal  Directed ? ?Descriptions of Associations:Intact ? ?Orientation:Full (Time, Place and Person) ? ?Thought Content:Illogical; Rumination ? ?History of Schizophrenia/Schizoaffective disorder:No data recorded ?Duration of Psychotic Symptoms:N/A ? ?Hallucinations:Hallucinations: None ? ?Ideas of Reference:None ? ?Suicidal Thoughts:Suicidal Thoughts: Yes, Active ?SI Active Intent and/or Plan: With Intent; With Plan ? ?Homicidal Thoughts:Homicidal Thoughts: No ? ? ?Sensorium  ?Memory:Immediate Good; Recent Good; Remote Good ? ?Judgment:Impaired ? ?Insight:Shallow ? ? ?Executive Functions  ?Concentration:Fair ? ?Attention Span:Fair ? ?Recall:Fair ? ?Fund of Knowledge:Good ? ?Language:Good ? ? ?Psychomotor Activity  ?Psychomotor Activity:Psychomotor Activity: Decreased ? ? ?Assets  ?Assets:Communication Skills; Desire for Improvement; Housing; Transportation; Social Support; Physical Health; Leisure Time; Talents/Skills ? ? ?Sleep  ?Sleep:Sleep: Fair ?Number of Hours of Sleep: 8 ? ? ? ?Physical Exam: ?Physical Exam ?ROS ?Blood pressure 108/61, pulse 75, temperature 98.2 ?F (36.8 ?C), temperature source Oral, resp. rate 18, height 5' 4.76" (1.645 m), weight 53.5 kg, last menstrual period 01/29/2022, SpO2 100 %. Body mass index is 19.77 kg/m?. ? ? ?Treatment Plan Summary: ?Daily contact with patient to assess and evaluate symptoms and progress in treatment  and Medication management ?Will maintain Q 15 minutes observation for safety.  Estimated LOS:  5-7 days ?Reviewed admission lab:CMP-WNL, CBC with differential-WNL, acetaminophen less than 10, glucose 85, urine pregnancy test negative, viral test-negative, urine toxin positive for tetrahydrocannabinol and EKG 12-lead-sinus rhythm with short PR interval otherwise normal EKG ?Patient will participate in  group, milieu, and family therapy. Psychotherapy:  Social and Doctor, hospital, anti-bullying, learning based strategies, cognitive behavioral, and family object  relations individuation separation intervention psychotherapies can be considered.  ?Medication management: Continue Zoloft 25 mg daily for depression and PTSD which can be titrated to 50 mg if tolerated well

## 2022-02-22 NOTE — [facility] Group Notes (Signed)
Child/Adolescent Psychoeducational Group Note ? ?Date:  02/22/2022 ?Time:  10:54 PM ? ?Group Topic/Focus:  Wrap-Up Group:   The focus of this group is to help patients review their daily goal of treatment and discuss progress on daily workbooks. ? ?Participation Level:  Active ? ?Participation Quality:  Sharing ? ?Affect:  Appropriate ? ?Cognitive:  Appropriate ? ?Insight:  Good ? ?Engagement in Group:  Engaged ? ?Modes of Intervention:  Support ? ?Additional Comments:  Pt goal for today was to keep a positive set and to move on from her past and to forgive herself.  Pt said she felt good when she was able to do those things. Pt day was a 7 out of 10. ? ?Amber Espinoza ?02/22/2022, 10:54 PM ?

## 2022-02-22 NOTE — [facility] Group Notes (Signed)
Pt attended and participated in a rules group. 

## 2022-02-22 NOTE — Progress Notes (Signed)
?   02/22/22 0800  ?Psych Admission Type (Psych Patients Only)  ?Admission Status Involuntary  ?Psychosocial Assessment  ?Patient Complaints None  ?Eye Contact Fair  ?Facial Expression Anxious  ?Affect Appropriate to circumstance  ?Speech Logical/coherent  ?Interaction Assertive  ?Motor Activity Other (Comment)  ?Appearance/Hygiene Unremarkable  ?Behavior Characteristics Cooperative  ?Mood Pleasant  ?Thought Process  ?Coherency WDL  ?Content WDL  ?Delusions None reported or observed  ?Perception WDL  ?Hallucination None reported or observed  ?Judgment Limited  ?Confusion None  ?Danger to Self  ?Current suicidal ideation? Denies  ?Danger to Others  ?Danger to Others None reported or observed  ? ? ?

## 2022-02-22 NOTE — BH IP Treatment Plan (Signed)
Interdisciplinary Treatment and Diagnostic Plan Update ? ?02/22/2022 ?Time of Session: 10:33 am ?Amber Espinoza ?MRN: [id number] ? ?Principal Diagnosis: Chronic post-traumatic stress disorder (PTSD) ? ?Secondary Diagnoses: Principal Problem: ?  Chronic post-traumatic stress disorder (PTSD) ?Active Problems: ?  Suicide attempt [facility]) ?  MDD (major depressive disorder), recurrent severe, without psychosis ([place]) ? ? ?Current Medications:  ?Current Facility-Administered Medications  ?Medication Dose Route Frequency Provider Last Rate Last Admin  ? acetaminophen (TYLENOL) tablet 650 mg  650 mg Oral Q6H PRN Ajibola, Ene A, NP      ? alum & mag hydroxide-simeth (MAALOX/MYLANTA) 200-200-20 MG/5ML suspension 30 mL  30 mL Oral Q6H PRN Ajibola, Ene A, NP      ? hydrOXYzine (ATARAX) tablet 25 mg  25 mg Oral QHS PRN,MR X 1 Ambrose Finland, MD   25 mg at 02/21/22 2000  ? magnesium hydroxide (MILK OF MAGNESIA) suspension 15 mL  15 mL Oral QHS PRN Ajibola, Ene A, NP      ? melatonin tablet 5 mg  5 mg Oral QHS Ambrose Finland, MD   5 mg at 02/21/22 2000  ? sertraline (ZOLOFT) tablet 25 mg  25 mg Oral Daily Ambrose Finland, MD   25 mg at 02/22/22 [id number]  ? ?PTA Medications: ?Medications Prior to Admission  ?Medication Sig Dispense Refill Last Dose  ? ibuprofen (ADVIL) 400 MG tablet Take 1 tablet (400 mg total) by mouth every 6 (six) hours as needed. 30 tablet 0   ? metroNIDAZOLE (FLAGYL) 500 MG tablet Take 1 tablet (500 mg total) by mouth 2 (two) times daily. (Patient not taking: Reported on 02/20/2022) 14 tablet 0   ? oseltamivir (TAMIFLU) 75 MG capsule Take 1 capsule (75 mg total) by mouth every 12 (twelve) hours. (Patient not taking: Reported on 02/20/2022) 10 capsule 0   ? promethazine-dextromethorphan (PROMETHAZINE-DM) 6.25-15 MG/5ML syrup Take 5 mLs by mouth 4 (four) times daily as needed for cough. (Patient not taking: Reported on 02/20/2022) 100 mL 0   ? ? ?Patient Stressors: Educational concerns   ?Traumatic  event   ? ?Patient Strengths: Ability for insight  ?Active sense of humor  ?Communication skills  ?Motivation for treatment/growth  ?Physical Health  ?Supportive family/friends  ? ?Treatment Modalities: Medication Management, Group therapy, Case management,  ?1 to 1 session with clinician, Psychoeducation, Recreational therapy. ? ? ?Physician Treatment Plan for Primary Diagnosis: Chronic post-traumatic stress disorder (PTSD) ?Long Term Goal(s): Improvement in symptoms so as ready for discharge  ? ?Short Term Goals: Ability to identify and develop effective coping behaviors will improve ?Ability to maintain clinical measurements within normal limits will improve ?Compliance with prescribed medications will improve ?Ability to identify triggers associated with substance abuse/mental health issues will improve ?Ability to identify changes in lifestyle to reduce recurrence of condition will improve ?Ability to verbalize feelings will improve ?Ability to disclose and discuss suicidal ideas ?Ability to demonstrate self-control will improve ? ?Medication Management: Evaluate patient's response, side effects, and tolerance of medication regimen. ? ?Therapeutic Interventions: 1 to 1 sessions, Unit Group sessions and Medication administration. ? ?Evaluation of Outcomes: Not Progressing ? ?Physician Treatment Plan for Secondary Diagnosis: Principal Problem: ?  Chronic post-traumatic stress disorder (PTSD) ?Active Problems: ?  Suicide attempt [facility]) ?  MDD (major depressive disorder), recurrent severe, without psychosis ([place]) ? ?Long Term Goal(s): Improvement in symptoms so as ready for discharge  ? ?Short Term Goals: Ability to identify and develop effective coping behaviors will improve ?Ability to maintain clinical measurements within normal limits will improve ?  Compliance with prescribed medications will improve ?Ability to identify triggers associated with substance abuse/mental health issues will improve ?Ability to  identify changes in lifestyle to reduce recurrence of condition will improve ?Ability to verbalize feelings will improve ?Ability to disclose and discuss suicidal ideas ?Ability to demonstrate self-control will improve    ? ?Medication Management: Evaluate patient's response, side effects, and tolerance of medication regimen. ? ?Therapeutic Interventions: 1 to 1 sessions, Unit Group sessions and Medication administration. ? ?Evaluation of Outcomes: Not Progressing ? ? ?RN Treatment Plan for Primary Diagnosis: Chronic post-traumatic stress disorder (PTSD) ?Long Term Goal(s): Knowledge of disease and therapeutic regimen to maintain health will improve ? ?Short Term Goals: Ability to remain free from injury will improve, Ability to verbalize frustration and anger appropriately will improve, Ability to demonstrate self-control, Ability to participate in decision making will improve, Ability to verbalize feelings will improve, Ability to disclose and discuss suicidal ideas, Ability to identify and develop effective coping behaviors will improve, and Compliance with prescribed medications will improve ? ?Medication Management: RN will administer medications as ordered by provider, will assess and evaluate patient's response and provide education to patient for prescribed medication. RN will report any adverse and/or side effects to prescribing provider. ? ?Therapeutic Interventions: 1 on 1 counseling sessions, Psychoeducation, Medication administration, Evaluate responses to treatment, Monitor vital signs and CBGs as ordered, Perform/monitor CIWA, COWS, AIMS and Fall Risk screenings as ordered, Perform wound care treatments as ordered. ? ?Evaluation of Outcomes: Not Progressing ? ? ?LCSW Treatment Plan for Primary Diagnosis: Chronic post-traumatic stress disorder (PTSD) ?Long Term Goal(s): Safe transition to appropriate next level of care at discharge, Engage patient in therapeutic group addressing interpersonal  concerns. ? ?Short Term Goals: Engage patient in aftercare planning with referrals and resources, Increase social support, Increase ability to appropriately verbalize feelings, Increase emotional regulation, and Increase skills for wellness and recovery ? ?Therapeutic Interventions: Assess for all discharge needs, 1 to 1 time with Education officer, museum, Explore available resources and support systems, Assess for adequacy in community support network, Educate family and significant other(s) on suicide prevention, Complete Psychosocial Assessment, Interpersonal group therapy. ? ?Evaluation of Outcomes: Not Progressing ? ? ?Progress in Treatment: ?Attending groups: Yes. ?Participating in groups: Yes. ?Taking medication as prescribed: Yes. ?Toleration medication: Yes. ?Family/Significant other contact made: Yes, individual(s) contacted:  [place], mother [phone] ?Patient understands diagnosis: Yes. ?Discussing patient identified problems/goals with staff: Yes. ?Medical problems stabilized or resolved: Yes. ?Denies suicidal/homicidal ideation: Yes. Pt denies SI/HI/AVH ?Issues/concerns per patient self-inventory: No. ?Other: na ? ?New problem(s) identified: No, Describe:  na ? ?New Short Term/Long Term Goal(s): Safe transition to appropriate next level of care at discharge, Engage patient in therapeutic groups addressing interpersonal concerns.  ? ? ?Patient Goals:  " I would like to work on how to deal with stress and depression" ? ?Discharge Plan or Barriers: Patient to return to parent/guardian care. Patient to follow up with outpatient therapy and medication management services.  ? ? ?Reason for Continuation of Hospitalization: Anxiety ?Depression ?Suicidal ideation ? ?Estimated Length of Stay: 5-7 days ? ? ?Scribe for Treatment Team: ?Carie Caddy, LCSW ?02/22/2022 ?10:43 AM ?

## 2022-02-23 MED ORDER — INFLUENZA VAC SPLIT QUAD 0.5 ML IM SUSY
0.5000 mL | PREFILLED_SYRINGE | INTRAMUSCULAR | Status: AC
  Administered 2022-02-24: 0.5 mL via INTRAMUSCULAR
  Filled 2022-02-23: qty 0.5

## 2022-02-23 NOTE — Progress Notes (Signed)
Amber Espinoza is quiet tonight. Minimal verbalization. No visitors today. Still waiting on mom to bring some clothes. Participated in group,wrapup. No physical complaints. Vistaril and Melatonin for sleep and anxiety. Seems to minimize rating depression and anxiety both a 0#.  ?

## 2022-02-23 NOTE — Progress Notes (Signed)
Child/Adolescent Psychoeducational Group Note ? ?Date:  02/23/2022 ?Time:  10:43 AM ? ?Group Topic/Focus:  Goals Group:   The focus of this group is to help patients establish daily goals to achieve during treatment and discuss how the patient can incorporate goal setting into their daily lives to aide in recovery. ? ?Participation Level:  Active ? ?Participation Quality:  Appropriate ? ?Affect:  Appropriate ? ?Cognitive:  Appropriate ? ?Insight:  Appropriate ? ?Engagement in Group:  Engaged ? ?Modes of Intervention:  Discussion ? ?Additional Comments:  Pt attended the goals group and remained appropriate and engaged throughout the duration of the group. ? ? ?Amber Espinoza ?02/23/2022, 10:43 AM ?

## 2022-02-23 NOTE — Progress Notes (Signed)
[facility] MD Progress Note ? ?02/23/2022 8:43 AM ?Amber Espinoza  ?MRN:  [id number] ? ?Subjective:   "I'm doing good today, my goal is to stay positive." ? ?On evaluation the patient reported: Patient was seen at her doorway this morning. States that she had a good day yesterday, and tried to remain busy by reading books and being social with the other patients. 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 She states she accomplished yesterday's goal which was "stay positive," and she hs the same goal today. Expressed coping skills of exercise and reading. Denies any visitors or phone calls yesterday. Has not communicated with her mother, and all of her siblings are younger. Reports that she is taking medications without side effects. She is unaware if they are having any benefit et, but she is not experiencing any harm from them. She states that she has had some difficulty staying asleep, but she is able to fall sleep. Her appetite is good, and she has been eating all meals and trying to drink plenty of water. She rates her depression a 0 out of 10, anxiety a 0 out of 10, and her anger a 0 out of 10 today. She denies SI, HI, or thoughts of self-harm. Expressed goal today of "trying to stay focused."  ? ?Principal Problem: Chronic post-traumatic stress disorder (PTSD) ?Diagnosis: Principal Problem: ?  Chronic post-traumatic stress disorder (PTSD) ?Active Problems: ?  Suicide attempt [facility]) ?  MDD (major depressive disorder), recurrent severe, without psychosis ([facility]) ? ?Total Time spent with patient: 30 minutes ? ?Past Psychiatric History: Depression and anxiety. ? ?Past Medical History:  ?Past Medical History:  ?Diagnosis Date  ? Depression   ? Migraines   ? History reviewed. No pertinent surgical history. ?Family History: History reviewed. No pertinent family history. ?Family Psychiatric  History: Depression in both parents. ?Social History:  ?Social History  ? ?Substance and Sexual Activity  ?Alcohol Use Yes  ? Comment:  occasionally at parties with her friends, 1 small shot  ?   ?Social History  ? ?Substance and Sexual Activity  ?Drug Use Yes  ? Types: Marijuana  ? Comment: 2 blunts every other day, last use: Thursday  ?  ?Social History  ? ?Socioeconomic History  ? Marital status: Single  ?  Spouse name: Not on file  ? Number of children: Not on file  ? Years of education: Not on file  ? Highest education level: Not on file  ?Occupational History  ? Not on file  ?Tobacco Use  ? Smoking status: Never  ? Smokeless tobacco: Never  ?Vaping Use  ? Vaping Use: Never used  ?Substance and Sexual Activity  ? Alcohol use: Yes  ?  Comment: occasionally at parties with her friends, 1 small shot  ? Drug use: Yes  ?  Types: Marijuana  ?  Comment: 2 blunts every other day, last use: Thursday  ? Sexual activity: Yes  ?  Birth control/protection: None, Other-see comments  ?  Comment: pt said that sometimes she'll use protection  ?Other Topics Concern  ? Not on file  ?Social History Narrative  ? Senior at [employer]. She has been dating her boyfriend for 2 months. She stays in [place], [place] with her mother, mother's boyfriend, and her 4 siblings. Pt was supposed to start a job at [employer].  ? ?Social Determinants of Health  ? ?Financial Resource Strain: Not on file  ?Food Insecurity: Not on file  ?Transportation Needs: Not on file  ?Physical  Activity: Not on file  ?Stress: Not on file  ?Social Connections: Not on file  ? ?Additional Social History:  ? ?Sleep: Fair ? ?Appetite:  Good ? ?Current Medications: ?Current Facility-Administered Medications  ?Medication Dose Route Frequency Provider Last Rate Last Admin  ? acetaminophen (TYLENOL) tablet 650 mg  650 mg Oral Q6H PRN Ajibola, Ene A, NP      ? alum & mag hydroxide-simeth (MAALOX/MYLANTA) 200-200-20 MG/5ML suspension 30 mL  30 mL Oral Q6H PRN Ajibola, Ene A, NP      ? hydrOXYzine (ATARAX) tablet 25 mg  25 mg Oral QHS PRN,MR X 1 Anagabriela Jokerst, MD   25 mg at  02/22/22 2110  ? magnesium hydroxide (MILK OF MAGNESIA) suspension 15 mL  15 mL Oral QHS PRN Ajibola, Ene A, NP      ? melatonin tablet 5 mg  5 mg Oral QHS Leata Mouse, MD   5 mg at 02/22/22 2110  ? sertraline (ZOLOFT) tablet 25 mg  25 mg Oral Daily Leata Mouse, MD   25 mg at 02/22/22 3419  ? ? ?Lab Results: No results found for this or any previous visit (from the past 48 hour(s)). ? ?Blood Alcohol level:  ?Lab Results  ?Component Value Date  ? ETH <10 02/19/2022  ? ? ?Metabolic Disorder Labs: ?No results found for: HGBA1C, MPG ?No results found for: PROLACTIN ?No results found for: CHOL, TRIG, HDL, CHOLHDL, VLDL, LDLCALC ? ?Physical Findings: ?AIMS: Facial and Oral Movements ?Muscles of Facial Expression: None, normal ?Lips and Perioral Area: None, normal ?Jaw: None, normal ?Tongue: None, normal,Extremity Movements ?Upper (arms, wrists, hands, fingers): None, normal ?Lower (legs, knees, ankles, toes): None, normal, Trunk Movements ?Neck, shoulders, hips: None, normal, Overall Severity ?Severity of abnormal movements (highest score from questions above): None, normal ?Incapacitation due to abnormal movements: None, normal ?Patient's awareness of abnormal movements (rate only patient's report): No Awareness, Dental Status ?Current problems with teeth and/or dentures?: No ?Does patient usually wear dentures?: No  ?CIWA:    ?COWS:    ? ?Musculoskeletal: ?Strength & Muscle Tone: within normal limits ?Gait & Station: normal ?Patient leans: N/A ? ?Psychiatric Specialty Exam: ? ?Presentation  ?General Appearance: Appropriate for Environment; Casual ? ?Eye Contact:Fair ? ?Speech:Clear and Coherent ? ?Speech Volume:Decreased ? ?Handedness:Right ? ? ?Mood and Affect  ?Mood:Depressed; Anxious ? ?Affect:Depressed; Constricted ? ? ?Thought Process  ?Thought Processes:Coherent; Goal Directed ? ?Descriptions of Associations:Intact ? ?Orientation:Full (Time, Place and Person) ? ?Thought Content:Illogical;  Rumination ? ?History of Schizophrenia/Schizoaffective disorder:No data recorded ?Duration of Psychotic Symptoms:N/A ? ?Hallucinations:No data recorded ? ?Ideas of Reference:None ? ?Suicidal Thoughts:No data recorded ? ?Homicidal Thoughts:No data recorded ? ? ?Sensorium  ?Memory:Immediate Good; Recent Good; Remote Good ? ?Judgment:Impaired ? ?Insight:Shallow ? ? ?Executive Functions  ?Concentration:Fair ? ?Attention Span:Fair ? ?Recall:Fair ? ?Fund of Knowledge:Good ? ?Language:Good ? ? ?Psychomotor Activity  ?Psychomotor Activity:No data recorded ? ? ?Assets  ?Assets:Communication Skills; Desire for Improvement; Housing; Transportation; Social Support; Physical Health; Leisure Time; Talents/Skills ? ? ?Sleep  ?Sleep:No data recorded ? ? ? ?Physical Exam: ?Physical Exam ?ROS ?Blood pressure 106/77, pulse 71, temperature 98 ?F (36.7 ?C), temperature source Oral, resp. rate 18, height 5' 4.76" (1.645 m), weight 53.5 kg, last menstrual period 01/29/2022, SpO2 99 %. Body mass index is 19.77 kg/m?. ? ? ?Treatment Plan Summary: ?Reviewed current treatment plan 02/23/2022 ? ?Patient has been adjusting to the milieu, group therapeutic activities and medication without adverse effects even though she reported briefly GI upset yesterday today she  has been able to eat well without having any difficulties.  Patient contract for safety while being hospital. ? ?Daily contact with patient to assess and evaluate symptoms and progress in treatment and Medication management ?Will maintain Q 15 minutes observation for safety.  Estimated LOS:  5-7 days ?Reviewed admission lab:CMP-WNL, CBC with differential-WNL, acetaminophen less than 10, glucose 85, urine pregnancy test negative, viral test-negative, urine toxin positive for tetrahydrocannabinol and EKG 12-lead-sinus rhythm with short PR interval otherwise normal EKG ?Patient will participate in  group, milieu, and family therapy. Psychotherapy:  Social and Forensic psychologistcommunication skill  training, anti-bullying, learning based strategies, cognitive behavioral, and family object relations individuation separation intervention psychotherapies can be considered.  ?Medication management: Continue Zoloft

## 2022-02-23 NOTE — Progress Notes (Shared)
[facility] Progress Note ? ?Subjective: "I'm doing good today, my goal is to stay positive." ? ?On evaluation the patient reported: Patient was seen at her doorway this morning. States that she had a good day yesterday, and tried to remain busy by reading books and being social with the other patients. States she worked on Public house managergoals worksheets as well. She states she accomplished yesterday's goal which was "stay positive," and she hs the same goal today. Expressed coping skills of exercise and reading. Denies any visitors or phone calls yesterday. Has not communicated with her mother, and all of her siblings are younger. Reports that she is taking medications without side effects. She is unaware if they are having any benefit et, but she is not experiencing any harm from them. She states that she has had some difficulty staying asleep, but she is able to fall sleep. Her appetite is good, and she has been eating all meals and trying to drink plenty of water. She rates her depression a 0 out of 10, anxiety a 0 out of 10, and her anger a 0 out of 10 today. She denies SI, HI, or thoughts of self-harm. Expressed goal today of "trying to stay focused." ?  ?Principal Problem: Chronic post-traumatic stress disorder (PTSD) ?Diagnosis: Principal Problem: ?  Chronic post-traumatic stress disorder (PTSD) ?Active Problems: ?  Suicide attempt [facility](HCC) ?  MDD (major depressive disorder), recurrent severe, without psychosis (HCC) ?  ?Total Time spent with patient: 30 minutes ?  ?Past Psychiatric History: Depression and anxiety. ?  ?Past Medical History:  ?    ?Past Medical History:  ?Diagnosis Date  ? Depression    ? Migraines    ? History reviewed. No pertinent surgical history. ?Family History: History reviewed. No pertinent family history. ?Family Psychiatric  History: Depression in both parents. ?Social History:  ?Social History  ?  ?    ?Substance and Sexual Activity  ?Alcohol Use Yes  ?  Comment: occasionally at parties with her friends, 1  small shot  ?   ?Social History  ?  ?    ?Substance and Sexual Activity  ?Drug Use Yes  ? Types: Marijuana  ?  Comment: 2 blunts every other day, last use: Thursday  ?  ?Social History  ?  ?     ?Socioeconomic History  ? Marital status: Single  ?    Spouse name: Not on file  ? Number of children: Not on file  ? Years of education: Not on file  ? Highest education level: Not on file  ?Occupational History  ? Not on file  ?Tobacco Use  ? Smoking status: Never  ? Smokeless tobacco: Never  ?Vaping Use  ? Vaping Use: Never used  ?Substance and Sexual Activity  ? Alcohol use: Yes  ?    Comment: occasionally at parties with her friends, 1 small shot  ? Drug use: Yes  ?    Types: Marijuana  ?    Comment: 2 blunts every other day, last use: Thursday  ? Sexual activity: Yes  ?    Birth control/protection: None, Other-see comments  ?    Comment: pt said that sometimes she'll use protection  ?Other Topics Concern  ? Not on file  ?Social History Narrative  ?  Senior at [employer]. She has been dating her boyfriend for 2 months. [place] in [place], [place] with her mother, mother's boyfriend, and her 4 siblings. Pt was supposed to start a job at [employer] tomorrow.  ?  ?  Social Determinants of Health  ?  ?Financial Resource Strain: Not on file  ?Food Insecurity: Not on file  ?Transportation Needs: Not on file  ?Physical Activity: Not on file  ?Stress: Not on file  ?Social Connections: Not on file  ?  ?Additional Social History:  ?  ?Sleep: Fair ?  ?Appetite:  Fair ?  ?Current Medications: ?         ?Current Facility-Administered Medications  ?Medication Dose Route Frequency Provider Last Rate Last Admin  ? acetaminophen (TYLENOL) tablet 650 mg  650 mg Oral Q6H PRN Ajibola, Ene A, NP      ? alum & mag hydroxide-simeth (MAALOX/MYLANTA) 200-200-20 MG/5ML suspension 30 mL  30 mL Oral Q6H PRN Ajibola, Ene A, NP      ? hydrOXYzine (ATARAX) tablet 25 mg  25 mg Oral QHS PRN,MR X 1 Leata Mouse, MD   25 mg at  02/21/22 2000  ? magnesium hydroxide (MILK OF MAGNESIA) suspension 15 mL  15 mL Oral QHS PRN Ajibola, Ene A, NP      ? melatonin tablet 5 mg  5 mg Oral QHS Leata Mouse, MD   5 mg at 02/21/22 2000  ? sertraline (ZOLOFT) tablet 25 mg  25 mg Oral Daily Leata Mouse, MD   25 mg at 02/22/22 2725  ?  ?  ?Lab Results:  ?Lab Results Last 48 Hours  ?No results found for this or any previous visit (from the past 48 hour(s)).  ? ?  ?Blood Alcohol level:  ?Recent Labs  ?     ?Lab Results  ?Component Value Date  ?  ETH <10 02/19/2022  ?  ?  ?  ?Metabolic Disorder Labs: ?Recent Labs  ?No results found for: HGBA1C, MPG  ? ?Recent Labs  ?No results found for: PROLACTIN  ? ?Recent Labs  ?No results found for: CHOL, TRIG, HDL, CHOLHDL, VLDL, LDLCALC  ? ?  ?Physical Findings: ?AIMS: Facial and Oral Movements ?Muscles of Facial Expression: None, normal ?Lips and Perioral Area: None, normal ?Jaw: None, normal ?Tongue: None, normal,Extremity Movements ?Upper (arms, wrists, hands, fingers): None, normal ?Lower (legs, knees, ankles, toes): None, normal, Trunk Movements ?Neck, shoulders, hips: None, normal, Overall Severity ?Severity of abnormal movements (highest score from questions above): None, normal ?Incapacitation due to abnormal movements: None, normal ?Patient's awareness of abnormal movements (rate only patient's report): No Awareness, Dental Status ?Current problems with teeth and/or dentures?: No ?Does patient usually wear dentures?: No  ?CIWA:    ?COWS:    ?  ?Musculoskeletal: ?Strength & Muscle Tone: within normal limits ?Gait & Station: normal ?Patient leans: N/A ?  ?Psychiatric Specialty Exam: ?  ?Presentation  ?General Appearance: Appropriate for Environment; Casual ?  ?Eye Contact:Fair ?  ?Speech:Clear and Coherent ?  ?Speech Volume:Decreased ?  ?Handedness:Right ?  ?  ?Mood and Affect  ?Mood:Depressed; Anxious ?  ?Affect:Depressed; Constricted ?  ?  ?Thought Process  ?Thought Processes:Coherent;  Goal Directed ?  ?Descriptions of Associations:Intact ?  ?Orientation:Full (Time, Place and Person) ?  ?Thought Content:Illogical; Rumination ?  ?History of Schizophrenia/Schizoaffective disorder:No data recorded ?Duration of Psychotic Symptoms:N/A ?  ?Hallucinations:Hallucinations: None ?  ?Ideas of Reference:None ?  ?Suicidal Thoughts:Suicidal Thoughts: Yes, Active ?SI Active Intent and/or Plan: With Intent; With Plan ?  ?Homicidal Thoughts:Homicidal Thoughts: No ?  ?  ?Sensorium  ?Memory:Immediate Good; Recent Good; Remote Good ?  ?Judgment:Impaired ?  ?Insight:Shallow ?  ?  ?Executive Functions  ?Concentration:Fair ?  ?Attention Span:Fair ?  ?Recall:Fair ?  ?Fund  of Knowledge:Good ?  ?Language:Good ?  ?  ?Psychomotor Activity  ?Psychomotor Activity:Psychomotor Activity: Decreased ?  ?  ?Assets  ?Assets:Communication Skills; Desire for Improvement; Housing; Transportation; Social Support; Physical Health; Leisure Time; Talents/Skills ?  ?  ?Sleep  ?Sleep:Sleep: Fair ?Number of Hours of Sleep: 8 ?  ?  ?  ?Physical Exam: ?Physical Exam ?ROS ?Blood pressure 108/61, pulse 75, temperature 98.2 ?F (36.8 ?C), temperature source Oral, resp. rate 18, height 5' 4.76" (1.645 m), weight 53.5 kg, last menstrual period 01/29/2022, SpO2 100 %. Body mass index is 19.77 kg/m?. ?  ?  ?Treatment Plan Summary: ?Daily contact with patient to assess and evaluate symptoms and progress in treatment and Medication management ?Will maintain Q 15 minutes observation for safety.  Estimated LOS:  5-7 days ?Reviewed admission lab:CMP-WNL, CBC with differential-WNL, acetaminophen less than 10, glucose 85, urine pregnancy test negative, viral test-negative, urine toxin positive for tetrahydrocannabinol and EKG 12-lead-sinus rhythm with short PR interval otherwise normal EKG ?Patient will participate in  group, milieu, and family therapy. Psychotherapy:  Social and Doctor, hospital, anti-bullying, learning based strategies, cognitive  behavioral, and family object relations individuation separation intervention psychotherapies can be considered.  ?Medication management: Continue Zoloft 25 mg daily for depression and PTSD which can be [employer]
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
## 2022-02-23 NOTE — Progress Notes (Signed)
D- Patient alert and oriented. Patient affect/mood reported as improving. " I am always a happy person". Denies SI, HI, AVH, and pain. Patient Goal:  " I feel like I accomplished my goals".  ? ?A- Scheduled medications administered to patient, per MD orders. Support and encouragement provided.  Routine safety checks conducted every 15 minutes.  Patient informed to notify staff with problems or concerns. ? ?R- No adverse drug reactions noted. Patient contracts for safety at this time. Patient compliant with medications and treatment plan. Patient receptive, calm, and cooperative. Patient interacts well with others on the unit.  Patient remains safe at this time.  ?

## 2022-02-23 NOTE — Progress Notes (Signed)
Recreation Therapy Note ? ?Date: 02/23/2022 ?Time: 1110 ?Location: Pt Room 101-01 ? ?1:1 Topic: Coping Skills ?  ?Goal Area(s) Addresses:  ?Patient will review and complete packet supporting gratitude practice as a healthy coping skill for use post d/c.  ? ?Intervention: Individual Activity Packet  ? ?Education: Pharmacologist, Positive Mindset, Power of Thankfulness, Benefits of Journaling, Discharge Planning ? ? ?Comments: LRT unable to facilitate regularly scheduled group programming due to active infection prevention protocols in place on unit. In lieu of recreation therapy group session, LRT provided pt a workbook reviewing gratitude concepts and offering an opportunity to write as an in-room activity supporting pt goals during treatment. This Clinical research associate reviewed techniques and styles of writing with the pt 1:1. LRT offered opportunity for follow-up regarding other needs and interests. Pt requested to have a longer novel from the on unit Geneticist, molecular, Clinical research associate delivered 2 options to pt room and pt selected a book from the [employer] series.  ? ? ?Amber Espinoza, LRT/CTRS ?Amber Espinoza ?02/23/2022 3:46 PM ?

## 2022-02-23 NOTE — Plan of Care (Signed)
  Problem: Education: Goal: Emotional status will improve Outcome: Progressing Goal: Mental status will improve Outcome: Progressing   

## 2022-02-24 MED ORDER — SERTRALINE HCL 50 MG PO TABS
50.0000 mg | ORAL_TABLET | Freq: Every day | ORAL | Status: DC
  Administered 2022-02-25: 50 mg via ORAL
  Filled 2022-02-24 (×5): qty 1

## 2022-02-24 NOTE — Progress Notes (Signed)
Recreation Therapy Note ? ?Date: 02/24/2022 ?Time: 1230 ?Location: Outdoor Picnic Table/Playground ? ?1:1 Topic: Leisure Education ?  ?Goal Area(s) Addresses:  ?Patient will review and complete packet supporting identification of healthy leisure and recreation activities.  ? ?Intervention: Individual Activity Packet  ? ?Education: Research officer, trade union, Veterinary surgeon, Leisure Styles, Leisure Activity Exposure, Discharge Planning ? ? ?Comments: LRT unable to facilitate regularly scheduled group programming due to active infection prevention protocols in place on unit. In lieu of recreation therapy group session, LRT provided pt a workbook reviewing healthy leisure and its impact on emotional states and personal fulfillment. The materials offered an opportunity to plan lifestyle changes post d/c. and complete puzzles as an in-room activity. This Probation officer reviewed all packet pages with the pt 1:1 to deliver verbal education and allow opportunity for pt tp ask questions. LRT then offered opportunity for follow-up regarding other needs and interests. Pt accepted packet and requested no additional resources. ? ?Goal Progress: Pt affect was bright and cheerful once outside. Pt expressed that they are spiritual and feel a sense of connection being in nature. Pt commented "the air smells so good!" Pt was eager to interact with LRT and attentive to packet content. Pt appeared receptive to 1:1 education. ? ? ?Fabiola Backer, LRT/CTRS ?Amber Espinoza ?02/24/2022 4:22 PM ?

## 2022-02-24 NOTE — Plan of Care (Signed)
  Problem: Education: Goal: Emotional status will improve Outcome: Progressing Goal: Mental status will improve Outcome: Progressing   

## 2022-02-24 NOTE — Progress Notes (Signed)
D- Patient alert and oriented. Affect/mood reported as improving. Denies SI, HI, AVH, and pain. Patient Goal:  " to have a good day".  ? ?A- Scheduled medications administered to patient, per MD orders. Support and encouragement provided.  Routine safety checks conducted every 15 minutes.  Patient informed to notify staff with problems or concerns. ? ?R- No adverse drug reactions noted. Patient contracts for safety at this time. Patient compliant with medications and treatment plan. Patient receptive, calm, and cooperative. Patient interacts well with others on the unit.  Patient remains safe at this time. ? ?

## 2022-02-24 NOTE — Progress Notes (Addendum)
[facility] ? ?02/24/2022 1:15 PM ?Amber Espinoza  ?MRN:  [id number] ? ?Subjective:  "I'm doing good" ? ?Reason for admission: Patient was [facility] to behavioral health [facility] from [facility] department due to worsening symptoms of depression, anxiety, phobias and s/p suicidal attempt by taking Motrin 200 mg x 40. ? ?Evaluation on the unit today. Jonnae was seen at her doorway this morning.  Patient is calm, cooperative and pleasant.  Patient is awake, alert, oriented to time place person and situation.  Patient reported no complaints today.  Patient endorses that her mood today is "happy."  Patient affect seems to be bright on approach.  She reports that she had difficulty staying asleep last night and took hydroxyzine 25mg  at 20:27. She reports the hydroxyzine helped with sleeping but she still feels drowsy this morning and wanted to take a nap. She denies difficulty falling asleep. She reports that her appetite is good and ate her breakfast earlier this morning. She reports that her goal is to "stay positive" and has been reading a lot of books as a coping strategy and for entertainment. She does not report any side effects to the sertraline and denies nausea/vomiting/bowel movement changes and mood activation. Today she denies any suicidal ideation, homicidal ideation, or audio/visual hallucinations.  ? ?Patient has no communication with her mother since yesterday and increased to talk her mother regarding ongoing treatment and possible disposition plans. ? ?Case discussed with treatment team meeting and the staff RN reported no new complaints and CSW reported found a couple of programs that might work for this young female who is trying to get into [facility] was not really supportive for her at this time.  Reportedly there [place] and other programs online which will be discussed with the patient regarding services she might  needed before discharged to the home. ? ?Principal Problem: Chronic post-traumatic stress disorder (PTSD) ?Diagnosis: Principal Problem: ?  Chronic post-traumatic stress disorder (PTSD) ?Active Problems: ?  Suicide attempt [facility](HCC) ?  MDD (major depressive disorder), recurrent severe, without psychosis (HCC) ? ?Total Time spent with patient: 30 minutes ? ?Past Psychiatric History: Depression and anxiety. ? ? ?Past Medical History:  ?Past Medical History:  ?Diagnosis Date  ? Depression   ? Migraines   ? History reviewed. No pertinent surgical history. ?Family History: History reviewed. No pertinent family history. ?Family Psychiatric  History: Depression in both parents ?Social History:  ?Social History  ? ?Substance and Sexual Activity  ?Alcohol Use Yes  ? Comment: occasionally at parties with her friends, 1 small shot  ?   ?Social History  ? ?Substance and Sexual Activity  ?Drug Use Yes  ? Types: Marijuana  ? Comment: 2 blunts every other day, last use: Thursday  ?  ?Social History  ? ?Socioeconomic History  ? Marital status: Single  ?  Spouse name: Not on file  ? Number of children: Not on file  ? Years of education: Not on file  ? Highest education level: Not on file  ?Occupational History  ? Not on file  ?Tobacco Use  ? Smoking status: Never  ? Smokeless tobacco: Never  ?Vaping Use  ? Vaping Use: Never used  ?Substance and Sexual Activity  ? Alcohol use: Yes  ?  Comment: occasionally at parties with her friends, 1 small shot  ? Drug use: Yes  ?  Types: Marijuana  ?  Comment: 2 blunts every  other day, last use: Thursday  ? Sexual activity: Yes  ?  Birth control/protection: None, Other-see comments  ?  Comment: pt said that sometimes she'll use protection  ?Other Topics Concern  ? Not on file  ?Social History Narrative  ? Senior at [employer]. She has been dating her boyfriend for 2 months. She stays in [place], [place] with her mother, mother's boyfriend, and her 4 siblings. Pt was supposed to start a job  at [employer].  ? ?Social Determinants of Health  ? ?Financial Resource Strain: Not on file  ?Food Insecurity: Not on file  ?Transportation Needs: Not on file  ?Physical Activity: Not on file  ?Stress: Not on file  ?Social Connections: Not on file  ? ?Additional Social History:  ?  ?Sleep: Fair ? ?Appetite:  Good ? ?Current Medications: ?Current Facility-Administered Medications  ?Medication Dose Route Frequency Provider Last Rate Last Admin  ? acetaminophen (TYLENOL) tablet 650 mg  650 mg Oral Q6H PRN Ajibola, Ene A, NP      ? alum & mag hydroxide-simeth (MAALOX/MYLANTA) 200-200-20 MG/5ML suspension 30 mL  30 mL Oral Q6H PRN Ajibola, Ene A, NP      ? hydrOXYzine (ATARAX) tablet 25 mg  25 mg Oral QHS PRN,MR X 1 Kimbery Harwood, MD   25 mg at 02/23/22 2027  ? magnesium hydroxide (MILK OF MAGNESIA) suspension 15 mL  15 mL Oral QHS PRN Ajibola, Ene A, NP      ? melatonin tablet 5 mg  5 mg Oral QHS Leata Mouse, MD   5 mg at 02/23/22 2027  ? sertraline (ZOLOFT) tablet 25 mg  25 mg Oral Daily Leata Mouse, MD   25 mg at 02/24/22 0830  ? ? ?Lab Results: No results found for this or any previous visit (from the past 48 hour(s)). ? ?Blood Alcohol level:  ?Lab Results  ?Component Value Date  ? ETH <10 02/19/2022  ? ? ?Metabolic Disorder Labs: ?No results found for: HGBA1C, MPG ?No results found for: PROLACTIN ?No results found for: CHOL, TRIG, HDL, CHOLHDL, VLDL, LDLCALC ? ?Physical Findings: ?AIMS: Facial and Oral Movements ?Muscles of Facial Expression: None, normal ?Lips and Perioral Area: None, normal ?Jaw: None, normal ?Tongue: None, normal,Extremity Movements ?Upper (arms, wrists, hands, fingers): None, normal ?Lower (legs, knees, ankles, toes): None, normal, Trunk Movements ?Neck, shoulders, hips: None, normal, Overall Severity ?Severity of abnormal movements (highest score from questions above): None, normal ?Incapacitation due to abnormal movements: None, normal ?Patient's  awareness of abnormal movements (rate only patient's report): No Awareness, Dental Status ?Current problems with teeth and/or dentures?: No ?Does patient usually wear dentures?: No  ?CIWA:    ?COWS:    ? ?Musculoskeletal: ?Strength & Muscle Tone: within normal limits ?Gait & Station: normal ?Patient leans: N/A ? ?Psychiatric Specialty Exam: ?Physical Exam ?Constitutional:   ?   General: She is not in acute distress. ?   Appearance: Normal appearance. She is well-groomed.  ?HENT:  ?   Head: Normocephalic and atraumatic.  ?Pulmonary:  ?   Effort: Pulmonary effort is normal.  ?Neurological:  ?   General: No focal deficit present.  ?   Mental Status: She is alert and oriented to person, place, and time.  ?  ?Review of Systems  ?Gastrointestinal:  Negative for abdominal pain, constipation, diarrhea, nausea and vomiting.   ?Blood pressure 103/71, pulse 85, temperature 98.7 ?F (37.1 ?C), temperature source Oral, resp. rate 18, height 5' 4.76" (1.645 m), weight 53.5 kg, last menstrual period  01/29/2022, SpO2 100 %.Body mass index is 19.77 kg/m?.  ?General Appearance: Well Groomed; appeared stated age  ?Eye Contact:  Good  ?Speech:  Clear and Coherent and Normal Rate  ?Volume:  Normal  ?Mood:   "happy"  ?Affect:  Appropriate, Congruent, and Full Range  ?Thought Process:  Goal Directed and Linear  ?Orientation:  Full (Time, Place, and Person)  ?Thought Content:  Logical; No hallucinations, paranoia, or delusions  ?Suicidal Thoughts:  No  ?Homicidal Thoughts:  No  ?Memory:  Recent;   Good  ?Judgement:  Fair  ?Insight:  Fair  ?Psychomotor Activity:  Normal  ?Concentration:  Attention Span: Good  ?Recall:  NA  ?Fund of Knowledge:  NA  ?Language:  NA  ?Akathisia:  No  ?Handed:  Right  ?AIMS (if indicated):     ?Assets:  Communication Skills ?Desire for Improvement  ?ADL's:  Intact  ?Cognition:  WNL  ?Sleep:   Okay  ? ? ? ? ?Physical Exam: ?Physical Exam ?Constitutional:   ?   General: She is not in acute distress. ?   Appearance:  Normal appearance. She is well-groomed.  ?HENT:  ?   Head: Normocephalic and atraumatic.  ?Pulmonary:  ?   Effort: Pulmonary effort is normal.  ?Neurological:  ?   General: No focal deficit present.  ?   Men

## 2022-02-24 NOTE — Progress Notes (Shared)
[facility] MD Progress Note ? ?02/24/2022 8:59 AM ?Amber Espinoza  ?MRN:  [id number] ? ?Subjective:   "I'm doing good today, my goal is to stay positive." ? ?On evaluation the patient reported: Patient was seen at her doorway this morning. States that she had a good day yesterday, and tried to remain busy by reading books and being social with the other patients. 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 She states she accomplished yesterday's goal which was "stay positive," and she hs the same goal today. Expressed coping skills of exercise and reading. Denies any visitors or phone calls yesterday. Has not communicated with her mother, and all of her siblings are younger. Reports that she is taking medications without side effects. She is unaware if they are having any benefit et, but she is not experiencing any harm from them. She states that she has had some difficulty staying asleep, but she is able to fall sleep. Her appetite is good, and she has been eating all meals and trying to drink plenty of water. She rates her depression a 0 out of 10, anxiety a 0 out of 10, and her anger a 0 out of 10 today. She denies SI, HI, or thoughts of self-harm. Expressed goal today of "trying to stay focused."  ? ?Principal Problem: Chronic post-traumatic stress disorder (PTSD) ?Diagnosis: Principal Problem: ?  Chronic post-traumatic stress disorder (PTSD) ?Active Problems: ?  Suicide attempt [facility]) ?  MDD (major depressive disorder), recurrent severe, without psychosis (HCC) ? ?Total Time spent with patient: 30 minutes ? ?Past Psychiatric History: Depression and anxiety. ? ?Past Medical History:  ?Past Medical History:  ?Diagnosis Date  ? Depression   ? Migraines   ? History reviewed. No pertinent surgical history. ?Family History: History reviewed. No pertinent family history. ?Family Psychiatric  History: Depression in both parents. ?Social History:  ?Social History  ? ?Substance and Sexual Activity  ?Alcohol Use Yes  ? Comment:  occasionally at parties with her friends, 1 small shot  ?   ?Social History  ? ?Substance and Sexual Activity  ?Drug Use Yes  ? Types: Marijuana  ? Comment: 2 blunts every other day, last use: Thursday  ?  ?Social History  ? ?Socioeconomic History  ? Marital status: Single  ?  Spouse name: Not on file  ? Number of children: Not on file  ? Years of education: Not on file  ? Highest education level: Not on file  ?Occupational History  ? Not on file  ?Tobacco Use  ? Smoking status: Never  ? Smokeless tobacco: Never  ?Vaping Use  ? Vaping Use: Never used  ?Substance and Sexual Activity  ? Alcohol use: Yes  ?  Comment: occasionally at parties with her friends, 1 small shot  ? Drug use: Yes  ?  Types: Marijuana  ?  Comment: 2 blunts every other day, last use: Thursday  ? Sexual activity: Yes  ?  Birth control/protection: None, Other-see comments  ?  Comment: pt said that sometimes she'll use protection  ?Other Topics Concern  ? Not on file  ?Social History Narrative  ? Senior at [employer]. She has been dating her boyfriend for 2 months. She stays in [place], [place] with her mother, mother's boyfriend, and her 4 siblings. Pt was supposed to start a job at [employer].  ? ?Social Determinants of Health  ? ?Financial Resource Strain: Not on file  ?Food Insecurity: Not on file  ?Transportation Needs: Not on file  ?Physical  Activity: Not on file  ?Stress: Not on file  ?Social Connections: Not on file  ? ?Additional Social History:  ? ?Sleep: Fair ? ?Appetite:  Good ? ?Current Medications: ?Current Facility-Administered Medications  ?Medication Dose Route Frequency Provider Last Rate Last Admin  ? acetaminophen (TYLENOL) tablet 650 mg  650 mg Oral Q6H PRN Ajibola, Ene A, NP      ? alum & mag hydroxide-simeth (MAALOX/MYLANTA) 200-200-20 MG/5ML suspension 30 mL  30 mL Oral Q6H PRN Ajibola, Ene A, NP      ? hydrOXYzine (ATARAX) tablet 25 mg  25 mg Oral QHS PRN,MR X 1 Leata Mouse, MD   25 mg at  02/23/22 2027  ? influenza vac split quadrivalent PF (FLUARIX) injection 0.5 mL  0.5 mL Intramuscular Tomorrow-1000 Leata Mouse, MD      ? magnesium hydroxide (MILK OF MAGNESIA) suspension 15 mL  15 mL Oral QHS PRN Ajibola, Ene A, NP      ? melatonin tablet 5 mg  5 mg Oral QHS Leata Mouse, MD   5 mg at 02/23/22 2027  ? sertraline (ZOLOFT) tablet 25 mg  25 mg Oral Daily Leata Mouse, MD   25 mg at 02/24/22 0830  ? ? ?Lab Results: No results found for this or any previous visit (from the past 48 hour(s)). ? ?Blood Alcohol level:  ?Lab Results  ?Component Value Date  ? ETH <10 02/19/2022  ? ? ?Metabolic Disorder Labs: ?No results found for: HGBA1C, MPG ?No results found for: PROLACTIN ?No results found for: CHOL, TRIG, HDL, CHOLHDL, VLDL, LDLCALC ? ?Physical Findings: ?AIMS: Facial and Oral Movements ?Muscles of Facial Expression: None, normal ?Lips and Perioral Area: None, normal ?Jaw: None, normal ?Tongue: None, normal,Extremity Movements ?Upper (arms, wrists, hands, fingers): None, normal ?Lower (legs, knees, ankles, toes): None, normal, Trunk Movements ?Neck, shoulders, hips: None, normal, Overall Severity ?Severity of abnormal movements (highest score from questions above): None, normal ?Incapacitation due to abnormal movements: None, normal ?Patient's awareness of abnormal movements (rate only patient's report): No Awareness, Dental Status ?Current problems with teeth and/or dentures?: No ?Does patient usually wear dentures?: No  ?CIWA:    ?COWS:    ? ?Musculoskeletal: ?Strength & Muscle Tone: within normal limits ?Gait & Station: normal ?Patient leans: N/A ? ?Psychiatric Specialty Exam: ? ?Presentation  ?General Appearance: Appropriate for Environment; Casual ? ?Eye Contact:Fair ? ?Speech:Clear and Coherent ? ?Speech Volume:Decreased ? ?Handedness:Right ? ? ?Mood and Affect  ?Mood:Depressed; Anxious ? ?Affect:Depressed; Constricted ? ? ?Thought Process  ?Thought  Processes:Coherent; Goal Directed ? ?Descriptions of Associations:Intact ? ?Orientation:Full (Time, Place and Person) ? ?Thought Content:Illogical; Rumination ? ?History of Schizophrenia/Schizoaffective disorder:No data recorded ?Duration of Psychotic Symptoms:N/A ? ?Hallucinations:No data recorded ? ?Ideas of Reference:None ? ?Suicidal Thoughts:No data recorded ? ?Homicidal Thoughts:No data recorded ? ? ?Sensorium  ?Memory:Immediate Good; Recent Good; Remote Good ? ?Judgment:Impaired ? ?Insight:Shallow ? ? ?Executive Functions  ?Concentration:Fair ? ?Attention Span:Fair ? ?Recall:Fair ? ?Fund of Knowledge:Good ? ?Language:Good ? ? ?Psychomotor Activity  ?Psychomotor Activity:No data recorded ? ? ?Assets  ?Assets:Communication Skills; Desire for Improvement; Housing; Transportation; Social Support; Physical Health; Leisure Time; Talents/Skills ? ? ?Sleep  ?Sleep:No data recorded ? ? ? ?Physical Exam: ?Physical Exam ?ROS ?Blood pressure 103/71, pulse 85, temperature 98.7 ?F (37.1 ?C), temperature source Oral, resp. rate 18, height 5' 4.76" (1.645 m), weight 53.5 kg, last menstrual period 01/29/2022, SpO2 100 %. Body mass index is 19.77 kg/m?. ? ? ?Treatment Plan Summary: ?Reviewed current treatment plan 02/24/2022 ? ?Patient has  been adjusting to the milieu, group therapeutic activities and medication without adverse effects even though she reported briefly GI upset yesterday today she has been able to eat well without having any difficulties.  Patient contract for safety while being hospital. ? ?Daily contact with patient to assess and evaluate symptoms and progress in treatment and Medication management ?Will maintain Q 15 minutes observation for safety.  Estimated LOS:  5-7 days ?Reviewed admission lab:CMP-WNL, CBC with differential-WNL, acetaminophen less than 10, glucose 85, urine pregnancy test negative, viral test-negative, urine toxin positive for tetrahydrocannabinol and EKG 12-lead-sinus rhythm with short PR  interval otherwise normal EKG ?Patient will participate in  group, milieu, and family therapy. Psychotherapy:  Social and Doctor, hospitalcommunication skill training, anti-bullying, learning based strategies, cognitive behavioral, and

## 2022-02-24 NOTE — [facility] Group Notes (Signed)
[facility] Group Notes:  (Nursing/MHT/Case Management/Adjunct) ? ?Date:  02/24/2022  ?Time:  3:02 PM ? ?Group Topic/Focus:  Goals Group: The focus of this group is to help patients establish daily goals to achieve during treatment and discuss how the patient can incorporate goal setting into their daily lives to aide in recovery. ? ?Participation Level:  Active ? ?Participation Quality:  Appropriate ? ?Affect:  Appropriate ? ?Cognitive:  Appropriate ? ?Insight:  Appropriate ? ?Engagement in Group:  Engaged ? ?Modes of Intervention:  Discussion ? ?Summary of Progress/Problems: ? ?Patient participated in goals group today. Patient's goal for today is to have a good day. Patient rates her day as a 9. No SI/HI. ? ?Daneil Dan ?02/24/2022, 3:02 PM ?

## 2022-02-24 NOTE — B[facility] Group Notes (Signed)
Child/Adolescent Psychoeducational Group Note ? ?Date:  02/24/2022 ?Time:  9:55 PM ? ?Group Topic/Focus:  Wrap-Up Group:   The focus of this group is to help patients review their daily goal of treatment and discuss progress on daily workbooks. ? ?Participation Level:  Active ? ?Participation Quality:  Monopolizing ? ?Affect:  Excited ? ?Cognitive:  Delusional ? ?Insight:  Improving ? ?Engagement in Group:  Monopolizing ? ?Modes of Intervention:  Support ? ?Additional Comments:  Pt was so happy to be outside of her room door for wrap up group.  Pt said her day was hard because she felt closed in and just needed someone to talk with.   Pt said her day was a 5 out of 10. The best part of her day was reading books. ? ?Shara Blazing ?02/24/2022, 9:55 PM ?

## 2022-02-25 DIAGNOSIS — F4312 Post-traumatic stress disorder, chronic: Secondary | ICD-10-CM

## 2022-02-25 MED ORDER — SERTRALINE HCL 50 MG PO TABS: 50.0000 mg | ORAL_TABLET | Freq: Every day | ORAL | 0 refills | Status: DC

## 2022-02-25 MED ORDER — HYDROXYZINE HCL 25 MG PO TABS: 25.0000 mg | ORAL_TABLET | Freq: Every evening | ORAL | 0 refills | Status: DC | PRN

## 2022-02-25 NOTE — Progress Notes (Addendum)
[facility] Child/Adolescent Case Management Discharge Plan : ? ?Will you be returning to the same living situation after discharge: Yes,  pt will return home with mother Amber Espinoza ?At discharge, do you have transportation home?:Yes,  pt will be transported by mother ?Do you have the ability to pay for your medications:Yes,  pt has active coverage ? ?Release of information consent forms completed and in the chart;  Patient's signature needed at discharge. ? ?Patient to Follow up at: ? Follow-up Information   ? ? Services, [facility]. Go on 03/01/2022.   ?Why: You have a hospital follow up appointment for therapy and medication management services on 03/01/22 at 9:00 am.   This appointment will be held in person.   Please bring photo ID, insurance card, and social security card if available. ?Contact information: ?[address] ?[place] [address] ?[phone] ? ? ?  ?  ? ? Youth Mentoring Program " [employer]" Follow up.   ?Why: Mission  of Program is---To empower girls with a crown of confidence by providing transformational mentoring programs that develop them into future leaders and community service pioneers. ? ?Also please follow up with your school counselor and Child psychotherapist. ?Contact information: ?[phone] ? ?  ?  ? ?  ?  ? ?  ? ? ?Family Contact:  Telephone:  Spoke with:  pt's mother Amber Espinoza [phone] ? ?Patient denies SI/HI:   Yes,  pt denies Si/HI/AVH    ? ?Safety Planning and Suicide Prevention discussed:  Yes,  SPE discussed with pt and pamphlet will be given at the time of discharge.  ?Patient to return to parent. Patient to follow up with outpatient therapy and medication management services.  ? ? ?Derrell Lolling R ?02/25/2022, 8:41 AM ?

## 2022-02-25 NOTE — Discharge Summary (Signed)
Physician Discharge Summary Note ? ?Patient:  Amber Espinoza is an 21 y.o., female ?MRN:  [id number] ?DOB:  07/29/2001 ?Patient phone:  [phone] (home)  ?Patient address:   ?[place] [address],  ?Total Time spent with patient: 30 minutes ? ?Date of Admission:  02/20/2022 ?Date of Discharge: 02/25/2022 ? ? ?Reason for Admission:   Amber Espinoza is a 21 years old female, admitted to behavioral health [facility] from [facility] emergency department due to worsening symptoms of depression, anxiety, phobias and s/p suicidal attempt by taking Motrin 200 mg x 40. ? ?Principal Problem: Chronic post-traumatic stress disorder (PTSD) ?Discharge Diagnoses: Principal Problem: ?  Chronic post-traumatic stress disorder (PTSD) ?Active Problems: ?  Suicide attempt [facility]) ?  MDD (major depressive disorder), recurrent severe, without psychosis ([place]) ? ? ?Past Psychiatric History: Depression and anxiety ? ?Past Medical History:  ?Past Medical History:  ?Diagnosis Date  ? Depression   ? Migraines   ? History reviewed. No pertinent surgical history. ?Family History: History reviewed. No pertinent family history. ?Family Psychiatric  History: Depression in both parents ?Social History:  ?Social History  ? ?Substance and Sexual Activity  ?Alcohol Use Yes  ? Comment: occasionally at parties with her friends, 1 small shot  ?   ?Social History  ? ?Substance and Sexual Activity  ?Drug Use Yes  ? Types: Marijuana  ? Comment: 2 blunts every other day, last use: Thursday  ?  ?Social History  ? ?Socioeconomic History  ? Marital status: Single  ?  Spouse name: Not on file  ? Number of children: Not on file  ? Years of education: Not on file  ? Highest education level: Not on file  ?Occupational History  ? Not on file  ?Tobacco Use  ? Smoking status: Never  ? Smokeless tobacco: Never  ?Vaping Use  ? Vaping Use: Never used  ?Substance and Sexual Activity  ? Alcohol use: Yes  ?  Comment: occasionally at parties with her friends, 1  small shot  ? Drug use: Yes  ?  Types: Marijuana  ?  Comment: 2 blunts every other day, last use: Thursday  ? Sexual activity: Yes  ?  Birth control/protection: None, Other-see comments  ?  Comment: pt said that sometimes she'll use protection  ?Other Topics Concern  ? Not on file  ?Social History Narrative  ? Senior at [employer]. She has been dating her boyfriend for 2 months. She stays in [place], [place] with her mother, mother's boyfriend, and her 4 siblings. Pt was supposed to start a job at [employer].  ? ?Social Determinants of Health  ? ?Financial Resource Strain: Not on file  ?Food Insecurity: Not on file  ?Transportation Needs: Not on file  ?Physical Activity: Not on file  ?Stress: Not on file  ?Social Connections: Not on file  ? ? ?Hospital Course:   ?Patient was admitted to the Child and adolescent  unit of [facility] under the service of Dr. Louretta Shorten. ?Safety:  Placed in Q15 minutes observation for safety. ?During the course of this hospitalization patient did not required any change on her observation and no PRN or time out was required.  No major behavioral problems reported during the hospitalization.  ?Routine labs reviewed: CMP-WNL, CBC with differential-WNL, acetaminophen less than 10, glucose 85, urine pregnancy test negative, viral test-negative, urine toxin positive for tetrahydrocannabinol and EKG 12-lead-sinus rhythm with short PR interval otherwise normal EKG.  ?An individualized treatment plan according to the  patient?s age, level of functioning, diagnostic considerations and acute behavior was initiated.  ?Preadmission medications, according to the guardian, consisted of no psychotropic medication. ?During this hospitalization she participated in all forms of therapy including  group, milieu, and family therapy.  Patient met with her psychiatrist on a daily basis and received full nursing service.  ?Due to long standing mood/behavioral symptoms the  patient was started in Zoloft 25 mg daily which was titrated to 45m daily for controlling the symptoms of depression and PTSD. She tolerated the lower dose without difficulties and also positively responded.  Patient was able to participate in patient program, able to complete her therapeutic packages and able to communicate well with staff members and this providers.  Patient has no safety concerns throughout this hospitalization encounter for safety at the time of discharge.  Patient will be referred to the outpatient medication management and counseling services as listed below. ?  Permission was granted from the guardian.  There  were no major adverse effects from the medication.  ? Patient was able to verbalize reasons for her living and appears to have a positive outlook toward her future.  A safety plan was discussed with her and her guardian. She was provided with national suicide Hotline phone # 1-800-273-TALK as well as [facility] number. ?General Medical Problems: Patient medically stable  and baseline physical exam within normal limits with no abnormal findings.Follow up with general medical care ?The patient appeared to benefit from the structure and consistency of the inpatient setting, continue current medication regimen and integrated therapies. During the hospitalization patient gradually improved as evidenced by: denied suicidal ideation, homicidal ideation, psychosis, depressive symptoms subsided.   She displayed an overall improvement in mood, behavior and affect. She was more cooperative and responded positively to redirections and limits set by the staff. The patient was able to verbalize age appropriate coping methods for use at home and school. ?At discharge conference was held during which findings, recommendations, safety plans and aftercare plan were discussed with the caregivers. Please refer to the therapist note for further information about issues discussed on  family session. ?On discharge patients denied psychotic symptoms, suicidal/homicidal ideation, intention or plan and there was no evidence of manic or depressive symptoms.  Patient was discharge home on stable condition  ? ?Physical Findings: ?AIMS: Facial and Oral Movements ?Muscles of Facial Expression: None, normal ?Lips and Perioral Area: None, normal ?Jaw: None, normal ?Tongue: None, normal,Extremity Movements ?Upper (arms, wrists, hands, fingers): None, normal ?Lower (legs, knees, ankles, toes): None, normal, Trunk Movements ?Neck, shoulders, hips: None, normal, Overall Severity ?Severity of abnormal movements (highest score from questions above): None, normal ?Incapacitation due to abnormal movements: None, normal ?Patient's awareness of abnormal movements (rate only patient's report): No Awareness, Dental Status ?Current problems with teeth and/or dentures?: No ?Does patient usually wear dentures?: No  ?CIWA:    ?COWS:    ? ?Musculoskeletal: ?Strength & Muscle Tone: within normal limits ?Gait & Station: normal ?Patient leans: N/A ? ? ?Psychiatric Specialty Exam: ? ?Presentation  ?General Appearance: Appropriate for Environment; Casual ? ?Eye Contact:Good ? ?Speech:Clear and Coherent ? ?Speech Volume:Normal ? ?Handedness:Right ? ? ?Mood and Affect  ?Mood:Euthymic ? ?Affect:Appropriate; Congruent ? ? ?Thought Process  ?Thought Processes:Coherent; Goal Directed ? ?Descriptions of Associations:Intact ? ?Orientation:Full (Time, Place and Person) ? ?Thought Content:Logical ? ?History of Schizophrenia/Schizoaffective disorder:No data recorded ?Duration of Psychotic Symptoms:N/A ? ?Hallucinations:Hallucinations: None ? ?Ideas of Reference:None ? ?Suicidal Thoughts:Suicidal Thoughts: No ? ?Homicidal Thoughts:Homicidal  Thoughts: No ? ? ?Sensorium  ?Memory:Immediate Good; Recent Good ? ?Judgment:Good ? ?Insight:Good ? ? ?Executive Functions  ?Concentration:Good ? ?Attention Span:Good ? ?Recall:Good ? ?Fund of  [place] ? ?Language:Good ? ? ?Psychomotor Activity  ?Psychomotor Activity:Psychomotor Activity: Normal ? ? ?Assets  ?Assets:Communication Skills; Desire for Improvement; Housing; Transportation; Social Support

## 2022-02-25 NOTE — Progress Notes (Signed)
Pt discharged to lobby. Pt was stable and appreciative at that time. All papers and prescriptions were given and valuables returned. Verbal understanding expressed. Denies SI/HI and A/VH. Pt given opportunity to express concerns and ask questions.  

## 2022-02-25 NOTE — BHH Suicide Risk Assessment (Signed)
[facility] Discharge Suicide Risk Assessment ? ? ?Principal Problem: Chronic post-traumatic stress disorder (PTSD) ?Discharge Diagnoses: Principal Problem: ?  Chronic post-traumatic stress disorder (PTSD) ?Active Problems: ?  Suicide attempt [facility]) ?  MDD (major depressive disorder), recurrent severe, without psychosis ([facility]) ? ? ?Total Time spent with patient: 15 minutes ? ?Musculoskeletal: ?Strength & Muscle Tone: within normal limits ?Gait & Station: normal ?Patient leans: N/A ? ?Psychiatric Specialty Exam ? ?Presentation  ?General Appearance: Appropriate for Environment; Casual ? ?Eye Contact:Good ? ?Speech:Clear and Coherent ? ?Speech Volume:Normal ? ?Handedness:Right ? ? ?Mood and Affect  ?Mood:Euthymic ? ?Duration of Depression Symptoms: No data recorded ?Affect:Appropriate; Congruent ? ? ?Thought Process  ?Thought Processes:Coherent; Goal Directed ? ?Descriptions of Associations:Intact ? ?Orientation:Full (Time, Place and Person) ? ?Thought Content:Logical ? ?History of Schizophrenia/Schizoaffective disorder:No data recorded ?Duration of Psychotic Symptoms:N/A ? ?Hallucinations:Hallucinations: None ? ?Ideas of Reference:None ? ?Suicidal Thoughts:Suicidal Thoughts: No ? ?Homicidal Thoughts:Homicidal Thoughts: No ? ? ?Sensorium  ?Memory:Immediate Good; Recent Good ? ?Judgment:Good ? ?Insight:Good ? ? ?Executive Functions  ?Concentration:Good ? ?Attention Span:Good ? ?Recall:Good ? ?Fund of Knowledge:Good ? ?Language:Good ? ? ?Psychomotor Activity  ?Psychomotor Activity:Psychomotor Activity: Normal ? ? ?Assets  ?Assets:Communication Skills; Desire for Improvement; Housing; Transportation; Social Support; Physical Health; Leisure Time ? ? ?Sleep  ?Sleep:Sleep: Good ?Number of Hours of Sleep: 9 ? ? ?Physical Exam: ?Physical Exam ?ROS ?Blood pressure 124/75, pulse 86, temperature 98.4 ?F (36.9 ?C), temperature source Oral, resp. rate 17, height 5' 4.76" (1.645 m), weight 53.5 kg, last menstrual period 01/29/2022, SpO2 100  %. Body mass index is 19.77 kg/m?. ? ?Mental Status Per Nursing Assessment::   ?On Admission:  Self-harm thoughts ? ?Demographic Factors:  ?Adolescent or young adult ? ?Loss Factors: ?NA ? ?Historical Factors: ?NA ? ?Risk Reduction Factors:   ?Sense of responsibility to family, Religious beliefs about death, Living with another person, especially a relative, Positive social support, Positive therapeutic relationship, and Positive coping skills or problem solving skills ? ?Continued Clinical Symptoms:  ?Severe Anxiety and/or Agitation ?Depression:   Recent sense of peace/wellbeing ?More than one psychiatric diagnosis ?Previous Psychiatric Diagnoses and Treatments ? ?Cognitive Features That Contribute To Risk:  ?Polarized thinking   ? ?Suicide Risk:  ?Minimal: No identifiable suicidal ideation.  Patients presenting with no risk factors but with morbid ruminations; may be classified as minimal risk based on the severity of the depressive symptoms ? ? Follow-up Information   ? ? Services, [facility]. Go on 03/01/2022.   ?Why: You have a hospital follow up appointment for therapy and medication management services on 03/01/22 at 9:00 am.   This appointment will be held in person.   Please bring photo ID, insurance card, and social security card if available. ?Contact information: ?[address] ?[place] [address] ?[phone] ? ? ?  ?  ? ? Youth Mentoring Program " [employer]" Follow up.   ?Why: Mission  of Program is---To empower girls with a crown of confidence by providing transformational mentoring programs that develop them into future leaders and community service pioneers. ? ?Also please follow up with your school counselor and Child psychotherapist. ?Contact information: ?[phone] ? ?  ?  ? ?  ?  ? ?  ? ? ?Plan Of Care/Follow-up recommendations:  ?Activity:  As tolerated ?Diet:  Regular ? ?Leata Mouse, MD ?02/25/2022, 8:54 AM ?

## 2022-02-25 NOTE — Plan of Care (Signed)
  Problem: Education: Goal: Emotional status will improve Outcome: Progressing Goal: Mental status will improve Outcome: Progressing   

## 2022-02-25 NOTE — BHH Group Notes (Signed)
Grief, Loss and Change ?02/25/2022 ?10:30-11:30 ? ?This group was unable to be held due to restrictions on gathering because of illness on the unit.   ? ?Chaplain Katy Deloras Reichard, Bcc ?Pager, [phone] ?

## 2022-09-01 ENCOUNTER — Ambulatory Visit: Admission: EM | Admit: 2022-09-01 | Discharge: 2022-09-01 | Discharge status: Absent without leave | Payer: Self-pay

## 2023-03-07 ENCOUNTER — Encounter (HOSPITAL_COMMUNITY): Payer: Self-pay

## 2023-03-07 ENCOUNTER — Other Ambulatory Visit: Payer: Self-pay

## 2023-03-07 ENCOUNTER — Emergency Department (HOSPITAL_COMMUNITY)
Admission: EM | Admit: 2023-03-07 | Discharge: 2023-03-07 | Discharge status: Against Medical Advice | Payer: Medicaid Other | Attending: Emergency Medicine | Admitting: Emergency Medicine

## 2023-03-07 DIAGNOSIS — B9689 Other specified bacterial agents as the cause of diseases classified elsewhere: Secondary | ICD-10-CM | POA: Insufficient documentation

## 2023-03-07 DIAGNOSIS — N760 Acute vaginitis: Secondary | ICD-10-CM | POA: Insufficient documentation

## 2023-03-07 DIAGNOSIS — R102 Pelvic and perineal pain: Secondary | ICD-10-CM | POA: Insufficient documentation

## 2023-03-07 DIAGNOSIS — Z5321 Procedure and treatment not carried out due to patient leaving prior to being seen by health care provider: Secondary | ICD-10-CM | POA: Insufficient documentation

## 2023-03-07 LAB — URINALYSIS, ROUTINE W REFLEX MICROSCOPIC
Bilirubin Urine: NEGATIVE
Glucose, UA: NEGATIVE mg/dL
Hgb urine dipstick: NEGATIVE
Ketones, ur: NEGATIVE mg/dL
Leukocytes,Ua: NEGATIVE
Nitrite: NEGATIVE
Protein, ur: 30 mg/dL — AB
Specific Gravity, Urine: 1.03 (ref 1.005–1.030)
pH: 5 (ref 5.0–8.0)

## 2023-03-07 LAB — PREGNANCY, URINE: Preg Test, Ur: NEGATIVE

## 2023-03-07 NOTE — ED Triage Notes (Addendum)
Pt c/o pelvic cramping that has been going on for months. Pt reports that she also has vaginal discharge. Pt states she has had BV before and she thinks she had it again, but has not gotten treatment for it. Pt denies dysuria.

## 2023-05-05 ENCOUNTER — Other Ambulatory Visit: Payer: Self-pay

## 2023-05-05 ENCOUNTER — Encounter (HOSPITAL_COMMUNITY): Payer: Self-pay

## 2023-05-05 ENCOUNTER — Emergency Department (HOSPITAL_COMMUNITY)
Admission: EM | Admit: 2023-05-05 | Discharge: 2023-05-05 | Disposition: A | Discharge status: Home / Self Care | Payer: Medicaid Other | Attending: Emergency Medicine | Admitting: Emergency Medicine

## 2023-05-05 DIAGNOSIS — W268XXA Contact with other sharp object(s), not elsewhere classified, initial encounter: Secondary | ICD-10-CM | POA: Insufficient documentation

## 2023-05-05 DIAGNOSIS — S51812A Laceration without foreign body of left forearm, initial encounter: Secondary | ICD-10-CM | POA: Insufficient documentation

## 2023-05-05 DIAGNOSIS — Y990 Civilian activity done for income or pay: Secondary | ICD-10-CM | POA: Insufficient documentation

## 2023-05-05 MED ORDER — LIDOCAINE-EPINEPHRINE (PF) 2 %-1:200000 IJ SOLN
10.0000 mL | Freq: Once | INTRAMUSCULAR | Status: AC
  Administered 2023-05-05: 10 mL
  Filled 2023-05-05: qty 20

## 2023-05-05 NOTE — ED Provider Notes (Signed)
[place] EMERGENCY DEPARTMENT AT [facility] Provider Note   CSN: [id number] Arrival date & time: 05/05/23  1441     History  Chief Complaint  Patient presents with   Laceration    Amber Espinoza is a 22 y.o. female.   Laceration   22 year old female presents emergency department with complaints of laceration.  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  Reports up-to-date on latest tetanus vaccination.  Denies trauma elsewhere.  Denies any weakness or sensory deficits distally in affected arm.  No significant pertinent past medical history.  Home Medications Prior to Admission medications   Medication Sig Start Date End Date Taking? Authorizing Provider  hydrOXYzine (ATARAX) 25 MG tablet Take 1 tablet (25 mg total) by mouth at bedtime as needed and may repeat dose one time if needed for anxiety. 02/25/22   Leata Mouse, MD  ibuprofen (ADVIL) 400 MG tablet Take 1 tablet (400 mg total) by mouth every 6 (six) hours as needed. 02/20/21   Merrilee Jansky, MD  sertraline (ZOLOFT) 50 MG tablet Take 1 tablet (50 mg total) by mouth daily. 02/25/22   Leata Mouse, MD      Allergies    Patient has no known allergies.    Review of Systems   Review of Systems  All other systems reviewed and are negative.   Physical Exam Updated Vital Signs BP 130/81   Pulse 84   Temp 99.6 F (37.6 C) (Oral)   Resp 18   Ht 5\' 3"  (1.6 m)   Wt 60.3 kg   LMP 04/19/2023 (Exact Date)   SpO2 100%   BMI 23.55 kg/m  Physical Exam Vitals and nursing note reviewed.  Constitutional:      General: She is not in acute distress.    Appearance: She is well-developed.  HENT:     Head: Normocephalic and atraumatic.  Eyes:     Conjunctiva/sclera: Conjunctivae normal.   Cardiovascular:     Rate and Rhythm: Normal rate and regular rhythm.     Heart sounds: No murmur heard. Pulmonary:     Effort: Pulmonary effort is normal. No respiratory distress.     Breath sounds: Normal breath sounds.  Abdominal:     Palpations: Abdomen is soft.     Tenderness: There is no abdominal tenderness.  Musculoskeletal:        General: No swelling.     Cervical back: Neck supple.  Skin:    General: Skin is warm and dry.     Capillary Refill: Capillary refill takes less than 2 seconds.     Comments: 7.5 cm linear laceration superficial noted on the dorsal aspect of patient's left upper extremity.  Mild active bleeding.  No obvious ligamentous/tendinous injury or foreign body.  Patient with full range of motion of bilateral shoulders, elbows, wrist, digits, radial pulses 2+ bilaterally.  No sensory deficits distally.  Neurological:     Mental Status: She is alert.  Psychiatric:        Mood and Affect: Mood normal.     ED Results / Procedures / Treatments   Labs (all labs ordered are listed, but only abnormal results are displayed) Labs Reviewed - No data to display  EKG None  Radiology No results  found.  Procedures .Marland KitchenLaceration Repair  Date/Time: 05/05/2023 4:00 PM  Performed by: Peter Garter, PA Authorized by: Peter Garter, PA   Consent:    Consent obtained:  Verbal   Consent given by:  Patient   Risks, benefits, and alternatives were discussed: yes     Risks discussed:  Infection, need for additional repair, nerve damage, poor wound healing, poor cosmetic result, pain, retained foreign body, tendon damage and vascular damage   Alternatives discussed:  Observation, referral, delayed treatment and no treatment Universal protocol:    Procedure explained and questions answered to patient or proxy's satisfaction: yes     Patient identity confirmed:  Verbally with patient Anesthesia:    Anesthesia method:  Local infiltration   Local anesthetic:   Lidocaine 2% WITH epi Laceration details:    Location:  Shoulder/arm   Shoulder/arm location:  L lower arm   Length (cm):  7.6 Pre-procedure details:    Preparation:  Patient was prepped and draped in usual sterile fashion Exploration:    Limited defect created (wound extended): no     Hemostasis achieved with:  Direct pressure   Imaging outcome: foreign body not noted     Wound exploration: wound explored through full range of motion and entire depth of wound visualized   Treatment:    Area cleansed with:  Saline   Amount of cleaning:  Standard   Irrigation solution:  Sterile saline   Irrigation volume:  500cc   Irrigation method:  Syringe   Visualized foreign bodies/material removed: no     Debridement:  None   Undermining:  None   Scar revision: no   Skin repair:    Repair method:  Sutures   Suture size:  4-0   Suture material:  Prolene   Number of sutures:  5 Approximation:    Approximation:  Close Repair type:    Repair type:  Simple Post-procedure details:    Dressing:  Bulky dressing   Procedure completion:  Tolerated well, no immediate complications     Medications Ordered in ED Medications  lidocaine-EPINEPHrine (XYLOCAINE W/EPI) 2 %-1:200000 (PF) injection 10 mL (10 mLs Infiltration Given 05/05/23 1526)    ED Course/ Medical Decision Making/ A&P                             Medical Decision Making Risk Prescription drug management.   This patient presents to the ED for concern of laceration, this involves an extensive number of treatment options, and is a complaint that carries with it a high risk of complications and morbidity.  The differential diagnosis includes laceration, ligamentous/tendinous injury, fracture, dislocation, neurovascular compromise   Co morbidities that complicate the patient evaluation  See HPI   Additional history obtained:  Additional history obtained from EMR External records from outside source obtained and reviewed  including hospital records   Lab Tests:  N/a   Imaging Studies ordered:  N/a   Cardiac Monitoring: / EKG:  The patient was maintained on a cardiac monitor.  I personally viewed and interpreted the cardiac monitored which showed an underlying rhythm of: Sinus rhythm   Consultations Obtained:  N/a   Problem List / ED Course / Critical interventions / Medication management  Laceration I ordered medication including lidocaine with epinephrine   Reevaluation of the patient after these medicines showed that the patient improved I have reviewed the patients home medicines and have made adjustments as needed  Social Determinants of Health:  Denies tobacco, illicit drug use   Test / Admission - Considered:  Laceration Vitals signs within normal range and stable throughout visit. 22 year old female presents emergency department with complaints of laceration to her left upper extremity.  Laceration repaired in manner as depicted above.  Patient without any acute sensory deficits/weakness on exam with relatively shallow appearing wound not extending beyond subcutaneous adipose tissue.  Patient educated regarding proper wound care at home and recommended prompt follow-up for suture removal at appropriate time.  Patient well-appearing and afebrile in no acute distress.  Treatment plan discussed at length with patient and she acknowledged understanding was agreeable to said plan. Worrisome signs and symptoms were discussed with the patient, and the patient acknowledged understanding to return to the ED if noticed. Patient was stable upon discharge.          Final Clinical Impression(s) / ED Diagnoses Final diagnoses:  Laceration of left forearm, initial encounter    Rx / DC Orders ED Discharge Orders     None         Peter Garter, [place] 05/05/23 1628    Loetta Rough, MD 05/05/23 [phone]

## 2023-05-05 NOTE — ED Triage Notes (Signed)
Pt got cut by razor blade at work. 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 Lt backside of arm pt states 2-3 inches in length.

## 2023-05-05 NOTE — Discharge Instructions (Signed)
Note that your laceration was repaired using 5 nonabsorbable stitches.  These should be removed in approximately 7 to 10 days either back in the emergency department, urgent care or family doctor.  Take Tylenol/Motrin as needed for pain.  Wash area gently with warm soapy water but avoid submersion in lakes, baths, pool, ocean.  Make sure to change bandages daily.  Please not hesitate to return to emergency department if the worrisome signs and symptoms we discussed become apparent.

## 2023-05-05 NOTE — ED Notes (Signed)
Spoke with patients employer and was okayed to perform our send out drug screen on patient. Drug screen done on patient and sent to [employer]. Pt received a copy of paper.

## 2024-07-07 ENCOUNTER — Emergency Department (HOSPITAL_COMMUNITY)
Admission: EM | Admit: 2024-07-07 | Discharge: 2024-07-08 | Disposition: A | Discharge status: Home / Self Care | Payer: MEDICAID | Attending: Emergency Medicine | Admitting: Emergency Medicine

## 2024-07-07 DIAGNOSIS — Z3A00 Weeks of gestation of pregnancy not specified: Secondary | ICD-10-CM | POA: Insufficient documentation

## 2024-07-07 DIAGNOSIS — Z3490 Encounter for supervision of normal pregnancy, unspecified, unspecified trimester: Secondary | ICD-10-CM

## 2024-07-07 DIAGNOSIS — E876 Hypokalemia: Secondary | ICD-10-CM | POA: Diagnosis not present

## 2024-07-07 DIAGNOSIS — O99019 Anemia complicating pregnancy, unspecified trimester: Secondary | ICD-10-CM | POA: Insufficient documentation

## 2024-07-07 DIAGNOSIS — O26899 Other specified pregnancy related conditions, unspecified trimester: Secondary | ICD-10-CM | POA: Diagnosis present

## 2024-07-07 DIAGNOSIS — R102 Pelvic and perineal pain: Secondary | ICD-10-CM

## 2024-07-07 LAB — CBC WITH DIFFERENTIAL/PLATELET
Abs Immature Granulocytes: 0.01 K/uL (ref 0.00–0.07)
Basophils Absolute: 0 K/uL (ref 0.0–0.1)
Basophils Relative: 0 %
Eosinophils Absolute: 0.1 K/uL (ref 0.0–0.5)
Eosinophils Relative: 2 %
HCT: 32.1 % — ABNORMAL LOW (ref 36.0–46.0)
Hemoglobin: 10.4 g/dL — ABNORMAL LOW (ref 12.0–15.0)
Immature Granulocytes: 0 %
Lymphocytes Relative: 35 %
Lymphs Abs: 2.6 K/uL (ref 0.7–4.0)
MCH: 29.5 pg (ref 26.0–34.0)
MCHC: 32.4 g/dL (ref 30.0–36.0)
MCV: 90.9 fL (ref 80.0–100.0)
Monocytes Absolute: 0.7 K/uL (ref 0.1–1.0)
Monocytes Relative: 9 %
Neutro Abs: 3.9 K/uL (ref 1.7–7.7)
Neutrophils Relative %: 54 %
Platelets: 360 K/uL (ref 150–400)
RBC: 3.53 MIL/uL — ABNORMAL LOW (ref 3.87–5.11)
RDW: 13.9 % (ref 11.5–15.5)
WBC: 7.3 K/uL (ref 4.0–10.5)
nRBC: 0 % (ref 0.0–0.2)

## 2024-07-07 LAB — URINALYSIS, ROUTINE W REFLEX MICROSCOPIC
Bacteria, UA: NONE SEEN
Bilirubin Urine: NEGATIVE
Glucose, UA: 50 mg/dL — AB
Hgb urine dipstick: NEGATIVE
Ketones, ur: 5 mg/dL — AB
Leukocytes,Ua: NEGATIVE
Nitrite: NEGATIVE
Protein, ur: NEGATIVE mg/dL
Specific Gravity, Urine: 1.029 (ref 1.005–1.030)
pH: 5 (ref 5.0–8.0)

## 2024-07-07 LAB — BASIC METABOLIC PANEL WITH GFR
Anion gap: 9 (ref 5–15)
BUN: 12 mg/dL (ref 6–20)
CO2: 23 mmol/L (ref 22–32)
Calcium: 9 mg/dL (ref 8.9–10.3)
Chloride: 103 mmol/L (ref 98–111)
Creatinine, Ser: 0.73 mg/dL (ref 0.44–1.00)
GFR, Estimated: >60 mL/min (ref >60)
Glucose, Bld: 82 mg/dL (ref 70–99)
Potassium: 3.3 mmol/L — ABNORMAL LOW (ref 3.5–5.1)
Sodium: 135 mmol/L (ref 135–145)

## 2024-07-07 LAB — PREGNANCY, URINE: Preg Test, Ur: POSITIVE — AB

## 2024-07-07 LAB — HCG, QUANTITATIVE, PREGNANCY: hCG, Beta Chain, Quant, S: 3001 m[IU]/mL — ABNORMAL HIGH (ref <5)

## 2024-07-07 NOTE — ED Triage Notes (Signed)
 Pt comes in for lower abd pain. Pt states its like a cramping sensation. Pt is A&Ox4. Pt denies any bleeding.   Pt took 2 prego test and they were positive.

## 2024-07-07 NOTE — ED Provider Notes (Signed)
 [place] EMERGENCY DEPARTMENT AT [facility] Provider Note   CSN: [id number] Arrival date & time: 07/07/24  2003     Patient presents with: Abdominal Pain and Possible Pregnancy   Amber Espinoza is a 23 y.o. female presents today for lower abdominal pain/pelvic that she describes as cramping.  Patient denies any vaginal bleeding.  Patient reports she took a pregnancy test at home which was positive.  Patient denies nausea, vomiting, abnormal vaginal discharge, or urinary symptoms.    Abdominal Pain Possible Pregnancy Associated symptoms include abdominal pain.       Prior to Admission medications   Medication Sig Start Date End Date Taking? Authorizing Provider  hydrOXYzine  (ATARAX ) 25 MG tablet Take 1 tablet (25 mg total) by mouth at bedtime as needed and may repeat dose one time if needed for anxiety. 02/25/22   Jonnalagadda, Janardhana, MD  ibuprofen  (ADVIL ) 400 MG tablet Take 1 tablet (400 mg total) by mouth every 6 (six) hours as needed. 02/20/21   Blaise Aleene KIDD, MD  sertraline  (ZOLOFT ) 50 MG tablet Take 1 tablet (50 mg total) by mouth daily. 02/25/22   Jonnalagadda, Janardhana, MD    Allergies: Patient has no known allergies.    Review of Systems  Gastrointestinal:  Positive for abdominal pain.  Genitourinary:  Positive for pelvic pain.    Updated Vital Signs BP (!) 118/97 (BP Location: Right Arm)   Pulse 82   Temp 98.5 F (36.9 C) (Oral)   Resp 18   Ht 5' 3 (1.6 m)   Wt 68 kg   LMP 06/04/2024 (Exact Date)   SpO2 100%   BMI 26.57 kg/m   Physical Exam Vitals and nursing note reviewed.  Constitutional:      General: She is not in acute distress.    Appearance: She is well-developed. She is not ill-appearing.  HENT:     Head: Normocephalic and atraumatic.  Eyes:     Extraocular Movements: Extraocular movements intact.     Conjunctiva/sclera: Conjunctivae normal.  Cardiovascular:     Rate and Rhythm: Normal rate and regular rhythm.     Heart  sounds: Normal heart sounds.  Pulmonary:     Effort: Pulmonary effort is normal. No respiratory distress.     Breath sounds: Normal breath sounds.  Abdominal:     General: There is no distension.     Palpations: Abdomen is soft.     Tenderness: There is no abdominal tenderness. There is no left CVA tenderness or guarding. Negative signs include Murphy's sign, Rovsing's sign and McBurney's sign.  Musculoskeletal:        General: No swelling.     Cervical back: Neck supple.  Skin:    General: Skin is warm and dry.     Capillary Refill: Capillary refill takes less than 2 seconds.  Neurological:     Mental Status: She is alert.  Psychiatric:        Mood and Affect: Mood normal.     (all labs ordered are listed, but only abnormal results are displayed) Labs Reviewed  PREGNANCY, URINE - Abnormal; Notable for the following components:      Result Value   Preg Test, Ur POSITIVE (*)    All other components within normal limits  HCG, QUANTITATIVE, PREGNANCY - Abnormal; Notable for the following components:   hCG, Beta Chain, Quant, S 3,001 (*)    All other components within normal limits  BASIC METABOLIC PANEL WITH GFR - Abnormal; Notable for the following components:  Potassium 3.3 (*)    All other components within normal limits  CBC WITH DIFFERENTIAL/PLATELET - Abnormal; Notable for the following components:   RBC 3.53 (*)    Hemoglobin 10.4 (*)    HCT 32.1 (*)    All other components within normal limits  URINALYSIS, ROUTINE W REFLEX MICROSCOPIC    EKG: None  Radiology: No results found.   Procedures   Medications Ordered in the ED - No data to display                                  Medical Decision Making Amount and/or Complexity of Data Reviewed Labs: ordered.   This patient presents to the ED for concern of pelvic cramping differential diagnosis includes miscarriage, ectopic pregnancy, normal pregnancy, menses   Lab Tests:  I Ordered, and personally  interpreted labs.  The pertinent results include: Positive pregnancy, anemia at 10.4, hypokalemia at 3.3  11:10 PM Care of Amber Espinoza transferred to Dr. Geroldine at the end of my shift as the patient will require reassessment once labs/imaging have resulted. Patient presentation, ED course, and plan of care discussed with review of all pertinent labs and imaging. Please see his/her note for further details regarding further ED course and disposition. Plan at time of handoff is Ectopic r/o. This may be altered or completely changed at the discretion of the oncoming team pending results of further workup.      Final diagnoses:  None    ED Discharge Orders     None          Francis Ileana LOISE DEVONNA 07/07/24 2310    Geroldine Berg, MD 07/08/24 [phone]

## 2024-07-08 ENCOUNTER — Emergency Department (HOSPITAL_COMMUNITY): Payer: MEDICAID

## 2024-07-08 NOTE — Discharge Instructions (Signed)
 Follow-up with OB/GYN.  [facility] has been provided in this discharge summary for you to call and make these arrangements.  The radiologist is recommending a repeat ultrasound in 2 weeks.  Please follow-up with your OB/GYN for this.  Return to the ER if you develop severe pain, high fever, severe bleeding, or for other new and concerning symptoms.  It is okay to take Tylenol  1000 mg every 8 hours as needed for pain.

## 2024-07-10 ENCOUNTER — Other Ambulatory Visit: Payer: Self-pay

## 2024-07-11 ENCOUNTER — Ambulatory Visit: Payer: Self-pay | Admitting: Adult Health

## 2024-07-11 LAB — BETA HCG QUANT (REF LAB): hCG Quant: 5338 m[IU]/mL

## 2024-07-11 NOTE — Telephone Encounter (Signed)
 Pt aware quant is rising. Pt has not had any bleeding only cramps. Pt has an appt 8/27 for dating US  and was advised to keep that appt. Advised to call with any questions or concerns. Pt voiced understanding. JSY

## 2024-07-11 NOTE — Telephone Encounter (Signed)
-----   Message from [place] sent at 07/11/2024  8:53 AM EDT ----- Please let her know HILDA is rising, and is she still having any bleeding??

## 2024-07-12 ENCOUNTER — Other Ambulatory Visit: Payer: Self-pay | Admitting: Obstetrics & Gynecology

## 2024-07-12 DIAGNOSIS — O3680X0 Pregnancy with inconclusive fetal viability, not applicable or unspecified: Secondary | ICD-10-CM

## 2024-07-25 ENCOUNTER — Ambulatory Visit (INDEPENDENT_AMBULATORY_CARE_PROVIDER_SITE_OTHER): Payer: MEDICAID

## 2024-07-25 DIAGNOSIS — O3680X0 Pregnancy with inconclusive fetal viability, not applicable or unspecified: Secondary | ICD-10-CM | POA: Diagnosis not present

## 2024-07-25 DIAGNOSIS — Z3A01 Less than 8 weeks gestation of pregnancy: Secondary | ICD-10-CM | POA: Diagnosis not present

## 2024-07-25 NOTE — Progress Notes (Signed)
 US  7+2 wks,single IUP with yolk sac,CRL 10.05 mm,normal ovaries,FHR 142 bpm

## 2024-08-07 ENCOUNTER — Encounter: Payer: MEDICAID | Admitting: Women's Health

## 2024-08-07 ENCOUNTER — Ambulatory Visit: Payer: MEDICAID | Admitting: *Deleted

## 2024-08-17 ENCOUNTER — Encounter: Payer: Self-pay | Admitting: Women's Health

## 2024-08-17 DIAGNOSIS — Z3400 Encounter for supervision of normal first pregnancy, unspecified trimester: Secondary | ICD-10-CM | POA: Insufficient documentation

## 2024-08-20 ENCOUNTER — Ambulatory Visit: Payer: MEDICAID | Admitting: *Deleted

## 2024-08-20 ENCOUNTER — Ambulatory Visit: Payer: MEDICAID | Admitting: Women's Health

## 2024-08-20 ENCOUNTER — Encounter: Payer: Self-pay | Admitting: Women's Health

## 2024-08-20 ENCOUNTER — Other Ambulatory Visit (HOSPITAL_COMMUNITY)
Admission: RE | Admit: 2024-08-20 | Discharge: 2024-08-20 | Disposition: A | Discharge status: Home / Self Care | Payer: MEDICAID | Source: Ambulatory Visit | Attending: Women's Health | Admitting: Women's Health

## 2024-08-20 VITALS — BP 114/83 | HR 103 | Wt 142.0 lb

## 2024-08-20 DIAGNOSIS — Z113 Encounter for screening for infections with a predominantly sexual mode of transmission: Secondary | ICD-10-CM

## 2024-08-20 DIAGNOSIS — Z1151 Encounter for screening for human papillomavirus (HPV): Secondary | ICD-10-CM

## 2024-08-20 DIAGNOSIS — Z3401 Encounter for supervision of normal first pregnancy, first trimester: Secondary | ICD-10-CM | POA: Diagnosis present

## 2024-08-20 DIAGNOSIS — F4310 Post-traumatic stress disorder, unspecified: Secondary | ICD-10-CM

## 2024-08-20 DIAGNOSIS — F419 Anxiety disorder, unspecified: Secondary | ICD-10-CM

## 2024-08-20 DIAGNOSIS — Z3A12 12 weeks gestation of pregnancy: Secondary | ICD-10-CM | POA: Diagnosis present

## 2024-08-20 DIAGNOSIS — Z124 Encounter for screening for malignant neoplasm of cervix: Secondary | ICD-10-CM

## 2024-08-20 DIAGNOSIS — O99341 Other mental disorders complicating pregnancy, first trimester: Secondary | ICD-10-CM

## 2024-08-20 DIAGNOSIS — F418 Other specified anxiety disorders: Secondary | ICD-10-CM | POA: Insufficient documentation

## 2024-08-20 DIAGNOSIS — Z3A11 11 weeks gestation of pregnancy: Secondary | ICD-10-CM

## 2024-08-20 DIAGNOSIS — F32A Depression, unspecified: Secondary | ICD-10-CM | POA: Diagnosis not present

## 2024-08-20 DIAGNOSIS — Z131 Encounter for screening for diabetes mellitus: Secondary | ICD-10-CM

## 2024-08-20 MED ORDER — BLOOD PRESSURE MONITOR MISC: 0 refills | Status: AC

## 2024-08-20 MED ORDER — ASPIRIN 81 MG PO TBEC: 81.0000 mg | DELAYED_RELEASE_TABLET | Freq: Every day | ORAL | 3 refills | Status: AC

## 2024-08-20 NOTE — Progress Notes (Signed)
 INITIAL OBSTETRICAL VISIT Patient name: Amber Espinoza MRN [id number]  Date of birth: 11-24-2001 Chief Complaint:   Initial Prenatal Visit  History of Present Illness:   Amber Espinoza is a 23 y.o. G1P0 African-American female at [redacted]w[redacted]d by LMP c/w u/s at 7 weeks with an Estimated Date of Delivery: 03/11/25 being seen today for her initial obstetrical visit.   Patient's last menstrual period was 06/04/2024 (exact date). Her obstetrical history is significant for primigravida.   Today she reports mild nausea, declines meds.  Last pap never. Results were: N/A     08/20/2024    3:08 PM  Depression screen PHQ 2/9  Decreased Interest 1  Down, Depressed, Hopeless 1  PHQ - 2 Score 2  Altered sleeping 2  Tired, decreased energy 3  Change in appetite 3  Feeling bad or failure about yourself  0  Trouble concentrating 0  Moving slowly or fidgety/restless 0  Suicidal thoughts 0  PHQ-9 Score 10        08/20/2024    3:08 PM  GAD 7 : Generalized Anxiety Score  Nervous, Anxious, on Edge 0  Control/stop worrying 0  Worry too much - different things 0  Trouble relaxing 0  Restless 0  Easily annoyed or irritable 3  Afraid - awful might happen 0  Total GAD 7 Score 3     Review of Systems:   Pertinent items are noted in HPI Denies cramping/contractions, leakage of fluid, vaginal bleeding, abnormal vaginal discharge w/ itching/odor/irritation, headaches, visual changes, shortness of breath, chest pain, abdominal pain, severe nausea/vomiting, or problems with urination or bowel movements unless otherwise stated above.  Pertinent History Reviewed:  Reviewed past medical,surgical, social, obstetrical and family history.  Reviewed problem list, medications and allergies. OB History  Gravida Para Term Preterm AB Living  1       SAB IAB Ectopic Multiple Live Births          # Outcome Date GA Lbr Len/2nd Weight Sex Type Anes PTL Lv  1 Current            Physical Assessment:   Vitals:    08/20/24 1454  BP: 114/83  Pulse: (!) 103  Weight: 142 lb (64.4 kg)  Body mass index is 25.15 kg/m.       Physical Examination:  General appearance - well appearing, and in no distress  Mental status - alert, oriented to person, place, and time  Psych:  She has a normal mood and affect  Skin - warm and dry, normal color, no suspicious lesions noted  Chest - effort normal, all lung fields clear to auscultation bilaterally  Heart - normal rate and regular rhythm  Abdomen - soft, nontender  Extremities:  No swelling or varicosities noted  Pelvic - VULVA: normal appearing vulva with no masses, tenderness or lesions  VAGINA: normal appearing vagina with normal color and discharge, no lesions  CERVIX: normal appearing cervix without discharge or lesions, no CMT  Thin prep pap is done w/ reflex HR HPV cotesting  Chaperone: Amber Espinoza  TODAY'S Informal TA u/s: +FCA and active fetus  No results found for this or any previous visit (from the past 24 hours).  Assessment & Plan:  1) Low-Risk Pregnancy G1P0 at [redacted]w[redacted]d with an Estimated Date of Delivery: 03/11/25   2) Initial OB visit  3) PTSD/dep/anx> no meds, doing well, IBH referral placed  4) Cervical cancer screen> pap today  Meds:  Meds ordered this encounter  Medications  Blood Pressure Monitor MISC    Sig: For regular home bp monitoring during pregnancy    Dispense:  1 each    Refill:  0    Z34.81 Please mail to patient   aspirin  EC 81 MG tablet    Sig: Take 1 tablet (81 mg total) by mouth daily. Swallow whole.    Dispense:  90 tablet    Refill:  3    Initial labs obtained Continue prenatal vitamins Reviewed n/v relief measures and warning s/s to report Reviewed recommended weight gain based on pre-gravid BMI Encouraged well-balanced diet Genetic & carrier screening discussed: requests Panorama, AFP, and Horizon  Ultrasound discussed; fetal survey: requested CCNC completed> form faxed if has or is planning to  apply for medicaid The nature of [place] [facility] [employer] with multiple MDs and other Advanced Practice Providers was explained to patient; also emphasized that fellows, residents, and students are part of our team. Does not have home bp cuff. Office bp cuff given: no. Rx sent: yes. Check bp weekly, let us  know if consistently >140/90.   Indications for ASA therapy (per uptodate) OR Two or more of the following: Nulliparity Yes Sociodemographic characteristics (African American race, low socioeconomic level) Yes  Follow-up: Return in about 4 weeks (around 09/17/2024) for LROB, CNM, in person; then @ 20w for anatomy u/s and LROB w/ CNM.   Orders Placed This Encounter  Procedures   Urine Culture   CBC/D/Plt+RPR+Rh+ABO+RubIgG...   PANORAMA PRENATAL TEST   HORIZON CUSTOM   Hemoglobin A1c   Amb ref to [facility]    Suzen JONELLE Fetters CNM, [facility] 08/20/2024 3:30 PM

## 2024-08-20 NOTE — Patient Instructions (Signed)
 [name], thank you for choosing our office today! We appreciate the opportunity to meet your healthcare needs. You may receive a short survey by mail, e-mail, or through [employer]. If you are happy with your care we would appreciate if you could take just a few minutes to complete the survey questions. We read all of your comments and take your feedback very seriously. Thank you again for choosing our office.  [facility] [employer] Healthcare Team at [facility]  [facility] at [facility] ([address] [place], [place] [address]) Entrance C, located off of [address] Free 24/7 valet parking   Nausea & Vomiting Have saltine crackers or pretzels by your bed and eat a few bites before you raise your head out of bed in the morning Eat small frequent meals throughout the day instead of large meals Drink plenty of fluids throughout the day to stay hydrated, just don't drink a lot of fluids with your meals.  This can make your stomach fill up faster making you feel sick Do not brush your teeth right after you eat Products with real ginger are good for nausea, like ginger ale and ginger hard candy Make sure it says made with real ginger! Sucking on sour candy like lemon heads is also good for nausea If your prenatal vitamins make you nauseated, take them at night so you will sleep through the nausea Sea Bands If you feel like you need medicine for the nausea & vomiting please let us  know If you are unable to keep any fluids or food down please let us  know   Constipation Drink plenty of fluid, preferably water, throughout the day Eat foods high in fiber such as fruits, vegetables, and grains Exercise, such as walking, is a good way to keep your bowels regular Drink warm fluids, especially warm prune juice, or decaf coffee Eat a 1/2 cup of real oatmeal (not instant), 1/2 cup applesauce, and 1/2-1 cup warm prune juice every day If needed, you may take Colace (docusate sodium) stool softener  once or twice a day to help keep the stool soft.  If you still are having problems with constipation, you may take Miralax once daily as needed to help keep your bowels regular.   Home Blood Pressure Monitoring for Patients   Your provider has recommended that you check your blood pressure (BP) at least once a week at home. If you do not have a blood pressure cuff at home, one will be provided for you. Contact your provider if you have not received your monitor within 1 week.   Helpful Tips for Accurate Home Blood Pressure Checks  Don't smoke, exercise, or drink caffeine 30 minutes before checking your BP Use the restroom before checking your BP (a full bladder can raise your pressure) Relax in a comfortable upright chair Feet on the ground Left arm resting comfortably on a flat surface at the level of your heart Legs uncrossed Back supported Sit quietly and don't talk Place the cuff on your bare arm Adjust snuggly, so that only two fingertips can fit between your skin and the top of the cuff Check 2 readings separated by at least one minute Keep a log of your BP readings For a visual, please reference this diagram: http://ccnc.care/bpdiagram  Provider Name: [facility]     Phone: [phone]  Zone 1: ALL CLEAR  Continue to monitor your symptoms:  BP reading is less than 140 (top number) or less than 90 (bottom  number)  No right upper stomach pain No headaches or seeing spots No feeling nauseated or throwing up No swelling in face and hands  Zone 2: CAUTION Call your doctor's office for any of the following:  BP reading is greater than 140 (top number) or greater than 90 (bottom number)  Stomach pain under your ribs in the middle or right side Headaches or seeing spots Feeling nauseated or throwing up Swelling in face and hands  Zone 3: EMERGENCY  Seek immediate medical care if you have any of the following:  BP reading is greater than160 (top number) or greater than  110 (bottom number) Severe headaches not improving with Tylenol  Serious difficulty catching your breath Any worsening symptoms from Zone 2    First Trimester of Pregnancy The first trimester of pregnancy is from week 1 until the end of week 12 (months 1 through 3). A week after a sperm fertilizes an egg, the egg will implant on the wall of the uterus. This embryo will begin to develop into a baby. Genes from you and your partner are forming the baby. The female genes determine whether the baby is a boy or a girl. At 6-8 weeks, the eyes and face are formed, and the heartbeat can be seen on ultrasound. At the end of 12 weeks, all the baby's organs are formed.  Now that you are pregnant, you will want to do everything you can to have a healthy baby. Two of the most important things are to get good prenatal care and to follow your health care provider's instructions. Prenatal care is all the medical care you receive before the baby's birth. This care will help prevent, find, and treat any problems during the pregnancy and childbirth. BODY CHANGES Your body goes through many changes during pregnancy. The changes vary from woman to woman.  You may gain or lose a couple of pounds at first. You may feel sick to your stomach (nauseous) and throw up (vomit). If the vomiting is uncontrollable, call your health care provider. You may tire easily. You may develop headaches that can be relieved by medicines approved by your health care provider. You may urinate more often. Painful urination may mean you have a bladder infection. You may develop heartburn as a result of your pregnancy. You may develop constipation because certain hormones are causing the muscles that push waste through your intestines to slow down. You may develop hemorrhoids or swollen, bulging veins (varicose veins). Your breasts may begin to grow larger and become tender. Your nipples may stick out more, and the tissue that surrounds them  (areola) may become darker. Your gums may bleed and may be sensitive to brushing and flossing. Dark spots or blotches (chloasma, mask of pregnancy) may develop on your face. This will likely fade after the baby is born. Your menstrual periods will stop. You may have a loss of appetite. You may develop cravings for certain kinds of food. You may have changes in your emotions from day to day, such as being excited to be pregnant or being concerned that something may go wrong with the pregnancy and baby. You may have more vivid and strange dreams. You may have changes in your hair. These can include thickening of your hair, rapid growth, and changes in texture. Some women also have hair loss during or after pregnancy, or hair that feels dry or thin. Your hair will most likely return to normal after your baby is born. WHAT TO EXPECT AT YOUR PRENATAL  VISITS During a routine prenatal visit: You will be weighed to make sure you and the baby are growing normally. Your blood pressure will be taken. Your abdomen will be measured to track your baby's growth. The fetal heartbeat will be listened to starting around week 10 or 12 of your pregnancy. Test results from any previous visits will be discussed. Your health care provider may ask you: How you are feeling. If you are feeling the baby move. If you have had any abnormal symptoms, such as leaking fluid, bleeding, severe headaches, or abdominal cramping. If you have any questions. Other tests that may be performed during your first trimester include: Blood tests to find your blood type and to check for the presence of any previous infections. They will also be used to check for low iron levels (anemia) and Rh antibodies. Later in the pregnancy, blood tests for diabetes will be done along with other tests if problems develop. Urine tests to check for infections, diabetes, or protein in the urine. An ultrasound to confirm the proper growth and development  of the baby. An amniocentesis to check for possible genetic problems. Fetal screens for spina bifida and Down syndrome. You may need other tests to make sure you and the baby are doing well. HOME CARE INSTRUCTIONS  Medicines Follow your health care provider's instructions regarding medicine use. Specific medicines may be either safe or unsafe to take during pregnancy. Take your prenatal vitamins as directed. If you develop constipation, try taking a stool softener if your health care provider approves. Diet Eat regular, well-balanced meals. Choose a variety of foods, such as meat or vegetable-based protein, fish, milk and low-fat dairy products, vegetables, fruits, and whole grain breads and cereals. Your health care provider will help you determine the amount of weight gain that is right for you. Avoid raw meat and uncooked cheese. These carry germs that can cause birth defects in the baby. Eating four or five small meals rather than three large meals a day may help relieve nausea and vomiting. If you start to feel nauseous, eating a few soda crackers can be helpful. Drinking liquids between meals instead of during meals also seems to help nausea and vomiting. If you develop constipation, eat more high-fiber foods, such as fresh vegetables or fruit and whole grains. Drink enough fluids to keep your urine clear or pale yellow. Activity and Exercise Exercise only as directed by your health care provider. Exercising will help you: Control your weight. Stay in shape. Be prepared for labor and delivery. Experiencing pain or cramping in the lower abdomen or low back is a good sign that you should stop exercising. Check with your health care provider before continuing normal exercises. Try to avoid standing for long periods of time. Move your legs often if you must stand in one place for a long time. Avoid heavy lifting. Wear low-heeled shoes, and practice good posture. You may continue to have sex  unless your health care provider directs you otherwise. Relief of Pain or Discomfort Wear a good support bra for breast tenderness.   Take warm sitz baths to soothe any pain or discomfort caused by hemorrhoids. Use hemorrhoid cream if your health care provider approves.   Rest with your legs elevated if you have leg cramps or low back pain. If you develop varicose veins in your legs, wear support hose. Elevate your feet for 15 minutes, 3-4 times a day. Limit salt in your diet. Prenatal Care Schedule your prenatal visits by the  twelfth week of pregnancy. They are usually scheduled monthly at first, then more often in the last 2 months before delivery. Write down your questions. Take them to your prenatal visits. Keep all your prenatal visits as directed by your health care provider. Safety Wear your seat belt at all times when driving. Make a list of emergency phone numbers, including numbers for family, friends, the hospital, and police and fire departments. General Tips Ask your health care provider for a referral to a local prenatal education class. Begin classes no later than at the beginning of month 6 of your pregnancy. Ask for help if you have counseling or nutritional needs during pregnancy. Your health care provider can offer advice or refer you to specialists for help with various needs. Do not use hot tubs, steam rooms, or saunas. Do not douche or use tampons or scented sanitary pads. Do not cross your legs for long periods of time. Avoid cat litter boxes and soil used by cats. These carry germs that can cause birth defects in the baby and possibly loss of the fetus by miscarriage or stillbirth. Avoid all smoking, herbs, alcohol, and medicines not prescribed by your health care provider. Chemicals in these affect the formation and growth of the baby. Schedule a dentist appointment. At home, brush your teeth with a soft toothbrush and be gentle when you floss. SEEK MEDICAL CARE IF:   You have dizziness. You have mild pelvic cramps, pelvic pressure, or nagging pain in the abdominal area. You have persistent nausea, vomiting, or diarrhea. You have a bad smelling vaginal discharge. You have pain with urination. You notice increased swelling in your face, hands, legs, or ankles. SEEK IMMEDIATE MEDICAL CARE IF:  You have a fever. You are leaking fluid from your vagina. You have spotting or bleeding from your vagina. You have severe abdominal cramping or pain. You have rapid weight gain or loss. You vomit blood or material that looks like coffee grounds. You are exposed to [place] measles and have never had them. You are exposed to fifth disease or chickenpox. You develop a severe headache. You have shortness of breath. You have any kind of trauma, such as from a fall or a car accident. Document Released: 11/09/2001 Document Revised: 04/01/2014 Document Reviewed: 09/25/2013 [facility] Patient Information 2015 [place], [place]. This information is not intended to replace advice given to you by your health care provider. Make sure you discuss any questions you have with your health care provider.

## 2024-08-21 ENCOUNTER — Ambulatory Visit: Payer: Self-pay | Admitting: Women's Health

## 2024-08-21 LAB — CBC/D/PLT+RPR+RH+ABO+RUBIGG...
Antibody Screen: NEGATIVE
Basophils Absolute: 0 x10E3/uL (ref 0.0–0.2)
Basos: 0 %
EOS (ABSOLUTE): 0.1 x10E3/uL (ref 0.0–0.4)
Eos: 1 %
HCV Ab: NONREACTIVE
HIV Screen 4th Generation wRfx: NONREACTIVE
Hematocrit: 37.9 % (ref 34.0–46.6)
Hemoglobin: 12.6 g/dL (ref 11.1–15.9)
Hepatitis B Surface Ag: NEGATIVE
Immature Grans (Abs): 0 x10E3/uL (ref 0.0–0.1)
Immature Granulocytes: 0 %
Lymphocytes Absolute: 1.9 x10E3/uL (ref 0.7–3.1)
Lymphs: 31 %
MCH: 29.8 pg (ref 26.6–33.0)
MCHC: 33.2 g/dL (ref 31.5–35.7)
MCV: 90 fL (ref 79–97)
Monocytes Absolute: 0.5 x10E3/uL (ref 0.1–0.9)
Monocytes: 9 %
Neutrophils Absolute: 3.6 x10E3/uL (ref 1.4–7.0)
Neutrophils: 59 %
Platelets: 377 x10E3/uL (ref 150–450)
RBC: 4.23 x10E6/uL (ref 3.77–5.28)
RDW: 13.9 % (ref 11.7–15.4)
RPR Ser Ql: NONREACTIVE
Rh Factor: POSITIVE
Rubella Antibodies, IGG: 3.69 {index} (ref >0.99)
WBC: 6.2 x10E3/uL (ref 3.4–10.8)

## 2024-08-21 LAB — HEMOGLOBIN A1C
Est. average glucose Bld gHb Est-mCnc: 105 mg/dL
Hgb A1c MFr Bld: 5.3 % (ref 4.8–5.6)

## 2024-08-21 LAB — HCV INTERPRETATION

## 2024-08-22 LAB — CYTOLOGY - PAP
Chlamydia: NEGATIVE
Comment: NEGATIVE
Comment: NORMAL
Diagnosis: NEGATIVE
Neisseria Gonorrhea: NEGATIVE

## 2024-08-22 LAB — URINE CULTURE

## 2024-08-27 LAB — PANORAMA PRENATAL TEST FULL PANEL:PANORAMA TEST PLUS 5 ADDITIONAL MICRODELETIONS: FETAL FRACTION: 7.9

## 2024-08-28 LAB — HORIZON CUSTOM: REPORT SUMMARY: NEGATIVE

## 2024-09-12 ENCOUNTER — Ambulatory Visit: Payer: MEDICAID | Admitting: Clinical

## 2024-09-12 DIAGNOSIS — Z91199 Patient's noncompliance with other medical treatment and regimen due to unspecified reason: Secondary | ICD-10-CM

## 2024-09-12 NOTE — BH Specialist Note (Signed)
 Pt did not arrive to video visit and did not answer the phone; Left HIPPA-compliant message to call back Warren from [employer] for [employer] at [facility] at  [phone] [facility] office).  ?; left MyChart message for patient.  ? ?

## 2024-09-17 ENCOUNTER — Encounter: Payer: Self-pay | Admitting: Women's Health

## 2024-09-17 ENCOUNTER — Ambulatory Visit (INDEPENDENT_AMBULATORY_CARE_PROVIDER_SITE_OTHER): Payer: MEDICAID | Admitting: Women's Health

## 2024-09-17 VITALS — BP 108/69 | HR 90 | Wt 138.4 lb

## 2024-09-17 DIAGNOSIS — Z3A15 15 weeks gestation of pregnancy: Secondary | ICD-10-CM | POA: Diagnosis not present

## 2024-09-17 DIAGNOSIS — Z3402 Encounter for supervision of normal first pregnancy, second trimester: Secondary | ICD-10-CM

## 2024-09-17 DIAGNOSIS — Z3401 Encounter for supervision of normal first pregnancy, first trimester: Secondary | ICD-10-CM

## 2024-09-17 DIAGNOSIS — Z363 Encounter for antenatal screening for malformations: Secondary | ICD-10-CM

## 2024-09-17 DIAGNOSIS — B009 Herpesviral infection, unspecified: Secondary | ICD-10-CM | POA: Insufficient documentation

## 2024-09-17 NOTE — Patient Instructions (Signed)
 Amber Espinoza, thank you for choosing our office today! We appreciate the opportunity to meet your healthcare needs. You may receive a short survey by mail, e-mail, or through [employer]. If you are happy with your care we would appreciate if you could take just a few minutes to complete the survey questions. We read all of your comments and take your feedback very seriously. Thank you again for choosing our office.  Center for [employer] Team at [facility] at [facility] ([address] [place], [place] [address]) Entrance C, located off of [address] Free 24/7 valet parking   Can call Warren back at [phone] to schedule appointment   Go to [email or web address] to register for FREE online childbirth classes  Call the office [phone]) or go to [facility] if: You begin to severe cramping Your water breaks.  Sometimes it is a big gush of fluid, sometimes it is just a trickle that keeps getting your panties wet or running down your legs You have vaginal bleeding.  It is normal to have a small amount of spotting if your cervix was checked.   [facility] Pediatricians/Family Doctors [facility]): [address] Luba BROCKS, [phone]           [facility]: [address], [phone]                [facility]): [address], [phone] (call to ask if accepting patients) [facility] Department: [address], [place], [phone]    [facility] Pediatricians/Family Doctors [facility]): [phone] [address], [phone] [facility]: [address] [place], [phone] [facility]: [address], [phone]  [facility] Doctors  [facility]): [phone] [facility]: [address], [phone]   [facility] Doctors [facility]: [address], [phone]  [facility] Doctors  [facility]: [phone], [phone]  Home Blood Pressure Monitoring for Patients   Your provider has recommended that you check your blood pressure (BP) at least once a week at home. If you do not have a blood pressure cuff at home, one will be provided for you. Contact your provider if you have not received your monitor within 1 week.   Helpful Tips for Accurate Home Blood Pressure Checks  Don't smoke, exercise, or drink caffeine 30 minutes before checking your BP Use the restroom before checking your BP (a full bladder can raise your pressure) Relax in a comfortable upright chair Feet on the ground Left arm resting comfortably on a flat surface at the level of your heart Legs uncrossed Back supported Sit quietly and don't talk Place the cuff on your bare arm Adjust snuggly, so that only two fingertips can fit between your skin and the top of the cuff Check 2 readings separated by at least one minute Keep a log of your BP readings For a visual, please reference this diagram: http://ccnc.care/bpdiagram  Provider Name: [facility]     Phone: [phone]  Zone 1: ALL CLEAR  Continue to monitor your symptoms:  BP reading is less than 140 (top number) or less than 90 (bottom number)  No right upper stomach pain No headaches or seeing spots No feeling nauseated or throwing up No swelling in face and hands  Zone 2: CAUTION Call your doctor's office for any of the following:  BP reading is greater than 140 (top number) or greater than 90 (bottom number)  Stomach pain under your ribs in the middle or right side Headaches or seeing spots Feeling nauseated or throwing up Swelling in face and hands  Zone 3: EMERGENCY  Seek immediate medical care if you have any of the following:  BP reading is greater than160 (top number) or greater than 110 (bottom number) Severe headaches not improving with Tylenol  Serious  difficulty catching your breath Any worsening symptoms from Zone 2     Second Trimester of Pregnancy The second trimester is from week 14 through week 27 (months 4 through 6). The second trimester is often a time when you feel your best. Your body has adjusted to being pregnant, and you begin to feel better physically. Usually, morning sickness has lessened or quit completely, you may have more energy, and you may have an increase in appetite. The second trimester is also a time when the fetus is growing rapidly. At the end of the sixth month, the fetus is about 9 inches long and weighs about 1 pounds. You will likely begin to feel the baby move (quickening) between 16 and 20 weeks of pregnancy. Body changes during your second trimester Your body continues to go through many changes during your second trimester. The changes vary from woman to woman. Your weight will continue to increase. You will notice your lower abdomen bulging out. You may begin to get stretch marks on your hips, abdomen, and breasts. You may develop headaches that can be relieved by medicines. The medicines should be approved by your health care provider. You may urinate more often because the fetus is pressing on your bladder. You may develop or continue to have heartburn as a result of your pregnancy. You may develop constipation because certain hormones are causing the muscles that push waste through your intestines to slow down. You may develop hemorrhoids or swollen, bulging veins (varicose veins). You may have back pain. This is caused by: Weight gain. Pregnancy hormones that are relaxing the joints in your pelvis. A shift in weight and the muscles that support your balance. Your breasts will continue to grow and they will continue to become tender. Your gums may bleed and may be sensitive to brushing and flossing. Dark spots or blotches (chloasma, mask of pregnancy) may develop on your face. This will likely fade  after the baby is born. A dark line from your belly button to the pubic area (linea nigra) may appear. This will likely fade after the baby is born. You may have changes in your hair. These can include thickening of your hair, rapid growth, and changes in texture. Some women also have hair loss during or after pregnancy, or hair that feels dry or thin. Your hair will most likely return to normal after your baby is born.  What to expect at prenatal visits During a routine prenatal visit: You will be weighed to make sure you and the fetus are growing normally. Your blood pressure will be taken. Your abdomen will be measured to track your baby's growth. The fetal heartbeat will be listened to. Any test results from the previous visit will be discussed.  Your health care provider may ask you: How you are feeling. If you are feeling the baby move. If you have had any abnormal symptoms, such as leaking fluid, bleeding, severe headaches, or abdominal cramping. If you are using any tobacco products, including cigarettes, chewing tobacco, and electronic cigarettes. If you  have any questions.  Other tests that may be performed during your second trimester include: Blood tests that check for: Low iron levels (anemia). High blood sugar that affects pregnant women (gestational diabetes) between 9 and 28 weeks. Rh antibodies. This is to check for a protein on red blood cells (Rh factor). Urine tests to check for infections, diabetes, or protein in the urine. An ultrasound to confirm the proper growth and development of the baby. An amniocentesis to check for possible genetic problems. Fetal screens for spina bifida and Down syndrome. HIV (human immunodeficiency virus) testing. Routine prenatal testing includes screening for HIV, unless you choose not to have this test.  Follow these instructions at home: Medicines Follow your health care provider's instructions regarding medicine use. Specific  medicines may be either safe or unsafe to take during pregnancy. Take a prenatal vitamin that contains at least 600 micrograms (mcg) of folic acid. If you develop constipation, try taking a stool softener if your health care provider approves. Eating and drinking Eat a balanced diet that includes fresh fruits and vegetables, whole grains, good sources of protein such as meat, eggs, or tofu, and low-fat dairy. Your health care provider will help you determine the amount of weight gain that is right for you. Avoid raw meat and uncooked cheese. These carry germs that can cause birth defects in the baby. If you have low calcium intake from food, talk to your health care provider about whether you should take a daily calcium supplement. Limit foods that are high in fat and processed sugars, such as fried and sweet foods. To prevent constipation: Drink enough fluid to keep your urine clear or pale yellow. Eat foods that are high in fiber, such as fresh fruits and vegetables, whole grains, and beans. Activity Exercise only as directed by your health care provider. Most women can continue their usual exercise routine during pregnancy. Try to exercise for 30 minutes at least 5 days a week. Stop exercising if you experience uterine contractions. Avoid heavy lifting, wear low heel shoes, and practice good posture. A sexual relationship may be continued unless your health care provider directs you otherwise. Relieving pain and discomfort Wear a good support bra to prevent discomfort from breast tenderness. Take warm sitz baths to soothe any pain or discomfort caused by hemorrhoids. Use hemorrhoid cream if your health care provider approves. Rest with your legs elevated if you have leg cramps or low back pain. If you develop varicose veins, wear support hose. Elevate your feet for 15 minutes, 3-4 times a day. Limit salt in your diet. Prenatal Care Write down your questions. Take them to your prenatal  visits. Keep all your prenatal visits as told by your health care provider. This is important. Safety Wear your seat belt at all times when driving. Make a list of emergency phone numbers, including numbers for family, friends, the hospital, and police and fire departments. General instructions Ask your health care provider for a referral to a local prenatal education class. Begin classes no later than the beginning of month 6 of your pregnancy. Ask for help if you have counseling or nutritional needs during pregnancy. Your health care provider can offer advice or refer you to specialists for help with various needs. Do not use hot tubs, steam rooms, or saunas. Do not douche or use tampons or scented sanitary pads. Do not cross your legs for long periods of time. Avoid cat litter boxes and soil used by cats. These carry germs that can  cause birth defects in the baby and possibly loss of the fetus by miscarriage or stillbirth. Avoid all smoking, herbs, alcohol, and unprescribed drugs. Chemicals in these products can affect the formation and growth of the baby. Do not use any products that contain nicotine or tobacco, such as cigarettes and e-cigarettes. If you need help quitting, ask your health care provider. Visit your dentist if you have not gone yet during your pregnancy. Use a soft toothbrush to brush your teeth and be gentle when you floss. Contact a health care provider if: You have dizziness. You have mild pelvic cramps, pelvic pressure, or nagging pain in the abdominal area. You have persistent nausea, vomiting, or diarrhea. You have a bad smelling vaginal discharge. You have pain when you urinate. Get help right away if: You have a fever. You are leaking fluid from your vagina. You have spotting or bleeding from your vagina. You have severe abdominal cramping or pain. You have rapid weight gain or weight loss. You have shortness of breath with chest pain. You notice sudden or  extreme swelling of your face, hands, ankles, feet, or legs. You have not felt your baby move in over an hour. You have severe headaches that do not go away when you take medicine. You have vision changes. Summary The second trimester is from week 14 through week 27 (months 4 through 6). It is also a time when the fetus is growing rapidly. Your body goes through many changes during pregnancy. The changes vary from woman to woman. Avoid all smoking, herbs, alcohol, and unprescribed drugs. These chemicals affect the formation and growth your baby. Do not use any tobacco products, such as cigarettes, chewing tobacco, and e-cigarettes. If you need help quitting, ask your health care provider. Contact your health care provider if you have any questions. Keep all prenatal visits as told by your health care provider. This is important. This information is not intended to replace advice given to you by your health care provider. Make sure you discuss any questions you have with your health care provider. Document Released: 11/09/2001 Document Revised: 04/22/2016 Document Reviewed: 01/16/2013 Elsevier Interactive Patient Education  2017 [employer].

## 2024-09-17 NOTE — Progress Notes (Signed)
    LOW-RISK PREGNANCY VISIT Patient name: Amber Espinoza MRN [id number]  Date of birth: 02-05-2001 Chief Complaint:   Routine Prenatal Visit  History of Present Illness:   Amber Espinoza is a 23 y.o. G1P0 female at [redacted]w[redacted]d with an Estimated Date of Delivery: 03/11/25 being seen today for ongoing management of a low-risk pregnancy.   Today she reports cramping. Contractions: Not present. Vag. Bleeding: None.   . denies leaking of fluid.     08/20/2024    3:08 PM  Depression screen PHQ 2/9  Decreased Interest 1  Down, Depressed, Hopeless 1  PHQ - 2 Score 2  Altered sleeping 2  Tired, decreased energy 3  Change in appetite 3  Feeling bad or failure about yourself  0  Trouble concentrating 0  Moving slowly or fidgety/restless 0  Suicidal thoughts 0  PHQ-9 Score 10        08/20/2024    3:08 PM  GAD 7 : Generalized Anxiety Score  Nervous, Anxious, on Edge 0  Control/stop worrying 0  Worry too much - different things 0  Trouble relaxing 0  Restless 0  Easily annoyed or irritable 3  Afraid - awful might happen 0  Total GAD 7 Score 3      Review of Systems:   Pertinent items are noted in HPI Denies abnormal vaginal discharge w/ itching/odor/irritation, headaches, visual changes, shortness of breath, chest pain, abdominal pain, severe nausea/vomiting, or problems with urination or bowel movements unless otherwise stated above. Pertinent History Reviewed:  Reviewed past medical,surgical, social, obstetrical and family history.  Reviewed problem list, medications and allergies. Physical Assessment:   Vitals:   09/17/24 1608  BP: 108/69  Pulse: 90  Weight: 138 lb 6.4 oz (62.8 kg)  Body mass index is 24.52 kg/m.        Physical Examination:   General appearance: Well appearing, and in no distress  Mental status: Alert, oriented to person, place, and time  Skin: Warm & dry  Cardiovascular: Normal heart rate noted  Respiratory: Normal respiratory effort, no distress  Abdomen:  Soft, gravid, nontender  Pelvic: Cervical exam deferred         Extremities:    Fetal Status: Fetal Heart Rate (bpm): 150        Chaperone: N/A No results found for this or any previous visit (from the past 24 hours).  Assessment & Plan:  1) Low-risk pregnancy G1P0 at [redacted]w[redacted]d with an Estimated Date of Delivery: 03/11/25    Meds: No orders of the defined types were placed in this encounter.  Labs/procedures today: AFP  Plan:  Continue routine obstetrical care  Next visit: prefers will be in person for u/s    Reviewed: Preterm labor symptoms and general obstetric precautions including but not limited to vaginal bleeding, contractions, leaking of fluid and fetal movement were reviewed in detail with the patient.  All questions were answered. Does have home bp cuff. Office bp cuff given: not applicable. Check bp weekly, let us  know if consistently >140 and/or >90.  Follow-up: Return for As scheduled.  Future Appointments  Date Time Provider Department Center  10/22/2024  3:00 PM [facility] US  [facility] None  10/22/2024  3:50 PM Kizzie Suzen SAUNDERS, CNM [facility]    Orders Placed This Encounter  Procedures   US  OB Comp + 14 Wk   AFP, Serum, Open Spina Bifida   Suzen SAUNDERS Kizzie CNM, [facility] 09/17/2024 4:23 PM

## 2024-09-19 ENCOUNTER — Ambulatory Visit: Payer: Self-pay | Admitting: Women's Health

## 2024-09-19 LAB — AFP, SERUM, OPEN SPINA BIFIDA
AFP MoM: 1.4
AFP Value: 46.5 ng/mL
Gest. Age on Collection Date: 15 wk
Maternal Age At EDD: 21.9 a
OSBR Risk 1 IN: 7201
Test Results:: NEGATIVE
Weight: 138 [lb_av]

## 2024-09-26 ENCOUNTER — Encounter: Payer: Self-pay | Admitting: Emergency Medicine

## 2024-09-26 ENCOUNTER — Other Ambulatory Visit: Payer: Self-pay

## 2024-09-26 ENCOUNTER — Ambulatory Visit
Admission: EM | Admit: 2024-09-26 | Discharge: 2024-09-26 | Disposition: A | Discharge status: Home / Self Care | Payer: MEDICAID | Attending: Nurse Practitioner | Admitting: Nurse Practitioner

## 2024-09-26 DIAGNOSIS — R8290 Unspecified abnormal findings in urine: Secondary | ICD-10-CM | POA: Diagnosis not present

## 2024-09-26 DIAGNOSIS — Z3492 Encounter for supervision of normal pregnancy, unspecified, second trimester: Secondary | ICD-10-CM | POA: Diagnosis present

## 2024-09-26 DIAGNOSIS — R309 Painful micturition, unspecified: Secondary | ICD-10-CM | POA: Insufficient documentation

## 2024-09-26 DIAGNOSIS — Z7982 Long term (current) use of aspirin: Secondary | ICD-10-CM | POA: Diagnosis not present

## 2024-09-26 DIAGNOSIS — O26892 Other specified pregnancy related conditions, second trimester: Secondary | ICD-10-CM | POA: Diagnosis not present

## 2024-09-26 DIAGNOSIS — R300 Dysuria: Secondary | ICD-10-CM | POA: Insufficient documentation

## 2024-09-26 DIAGNOSIS — R1024 Suprapubic pain: Secondary | ICD-10-CM | POA: Diagnosis not present

## 2024-09-26 DIAGNOSIS — Z3A16 16 weeks gestation of pregnancy: Secondary | ICD-10-CM | POA: Insufficient documentation

## 2024-09-26 LAB — POCT URINE DIPSTICK
Bilirubin, UA: NEGATIVE
Blood, UA: NEGATIVE
Glucose, UA: NEGATIVE mg/dL
Leukocytes, UA: NEGATIVE
Nitrite, UA: NEGATIVE
POC PROTEIN,UA: 30 — AB
Spec Grav, UA: >=1.03 — AB
Urobilinogen, UA: 0.2 U/dL
pH, UA: 6

## 2024-09-26 NOTE — ED Provider Notes (Signed)
 [facility]    CSN: [id number] Arrival date & time: 09/26/24  1648      History   Chief Complaint Chief Complaint  Patient presents with   Dysuria    HPI Amber Espinoza is a 23 y.o. female.   The history is provided by the patient.   Patient presents with a 1 week history of pain with urination and suprapubic pressure.  Patient states that she feels a sharp pain with urination.  She denies fever, chills, urinary frequency, urgency, hesitancy, flank pain, decreased urine stream, or hematuria.  Patient denies history of recurrent urinary tract infections.  Patient reports she is [redacted] weeks pregnant.  States her next appointment with her OB is next month.  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 No pertinent surgical history.  OB History     Gravida  1   Para      Term      Preterm      AB      Living         SAB      IAB      Ectopic      Multiple      Live Births               Home Medications    Prior to Admission medications   Medication Sig Start Date End Date Taking? Authorizing Provider  aspirin  EC 81 MG tablet Take 1 tablet (81 mg total) by mouth daily. Swallow whole. 08/20/24   Kizzie Suzen SAUNDERS, CNM  Blood Pressure Monitor MISC For regular home bp monitoring during pregnancy Patient not taking: Reported on 09/17/2024 08/20/24   Kizzie Suzen SAUNDERS, CNM  Prenatal Vit-Fe Fumarate-FA (PRENATAL VITAMIN PO) Take by mouth.    [provider]    Family History History reviewed. No pertinent family history.  Social History Social  History   Tobacco Use   Smoking status: Never   Smokeless tobacco: Never  Vaping Use   Vaping status: Never Used  Substance Use Topics   Alcohol use: Not Currently   Drug use: Not Currently    Types: Marijuana     Allergies   Patient has no known allergies.   Review of Systems Review of Systems Per HPI  Physical Exam Triage Vital Signs ED Triage Vitals  Encounter Vitals Group     BP 09/26/24 1712 106/61     Girls Systolic BP Percentile --      Girls Diastolic BP Percentile --      Boys Systolic BP Percentile --      Boys Diastolic BP Percentile --      Pulse Rate 09/26/24 1712 100     Resp 09/26/24 1712 20     Temp 09/26/24 1712 98.4 F (36.9 C)     Temp Source 09/26/24 1712 Oral     SpO2 09/26/24 1712 96 %     Weight --      Height --      Head Circumference --      Peak Flow --      Pain  Score 09/26/24 1714 0     Pain Loc --      Pain Education --      Exclude from Growth Chart --    No data found.  Updated Vital Signs BP 106/61 (BP Location: Right Arm)   Pulse 100   Temp 98.4 F (36.9 C) (Oral)   Resp 20   LMP 06/04/2024 (Exact Date)   SpO2 96%   Visual Acuity Right Eye Distance:   Left Eye Distance:   Bilateral Distance:    Right Eye Near:   Left Eye Near:    Bilateral Near:     Physical Exam Vitals and nursing note reviewed.  Constitutional:      General: She is not in acute distress.    Appearance: Normal appearance.  HENT:     Head: Normocephalic.  Eyes:     Extraocular Movements: Extraocular movements intact.     Pupils: Pupils are equal, round, and reactive to light.  Cardiovascular:     Rate and Rhythm: Normal rate and regular rhythm.     Pulses: Normal pulses.     Heart sounds: Normal heart sounds.  Pulmonary:     Effort: Pulmonary effort is normal.     Breath sounds: Normal breath sounds.  Abdominal:     General: Bowel sounds are normal.     Palpations: Abdomen is soft.     Tenderness: There is no abdominal tenderness.  There is no right CVA tenderness or left CVA tenderness.  Musculoskeletal:     Cervical back: Normal range of motion.  Skin:    General: Skin is warm and dry.  Neurological:     General: No focal deficit present.     Mental Status: She is alert and oriented to person, place, and time.  Psychiatric:        Mood and Affect: Mood normal.        Behavior: Behavior normal.      UC Treatments / Results  Labs (all labs ordered are listed, but only abnormal results are displayed) Labs Reviewed  POCT URINE DIPSTICK - Abnormal; Notable for the following components:      Result Value   Clarity, UA cloudy (*)    Ketones, POC UA >= (160) (*)    Spec Grav, UA >=1.030 (*)    POC PROTEIN,UA =30 (*)    All other components within normal limits  URINE CULTURE    EKG   Radiology No results found.  Procedures Procedures (including critical care time)  Medications Ordered in UC Medications - No data to display  Initial Impression / Assessment and Plan / UC Course  I have reviewed the triage vital signs and the nursing notes.  Pertinent labs & imaging results that were available during my care of the patient were reviewed by me and considered in my medical decision making (see chart for details).  Analysis was negative for signs of infection.  Urinalysis does show elevated specific gravity, proteins, ketones, and cloudy appearance.  Urine culture is pending.  In the interim, patient was advised to increase her fluid intake, advised patient she should be drinking at least 8-10 8 ounce glasses of water daily.  Also encourage patient to maintain a well-balanced diet due to her current pregnancy.  Patient was advised that it is recommended that she follow-up with her obstetrician this week or soon as possible for reevaluation of the abnormal findings on her urinalysis.  Patient was given strict ER follow-up precautions.  Patient was  in agreement with this plan of care and verbalizes  understanding.  All questions were answered.  Patient stable for discharge.   Final Clinical Impressions(s) / UC Diagnoses   Final diagnoses:  Dysuria  Abnormal urinalysis  Second trimester pregnancy     Discharge Instructions      The urinalysis did not indicate an obvious urinary tract infection.  A urine culture has been ordered.  You will be contacted if the pending test results are abnormal.  You will also have access to the results via MyChart. Make sure you are drinking at least 8-10 8 ounce glasses of water daily. Make sure you are eating a healthy and well-balanced diet. I would like for you to call your obstetrician tomorrow to schedule an appointment for reevaluation as soon as possible. Go to the emergency department if you experience fever, chills, abdominal pain, abnormal vaginal bleeding, or other concerns. Follow-up as needed.     ED Prescriptions   None    PDMP not reviewed this encounter.   Gilmer Etta PARAS, NP 09/26/24 1739

## 2024-09-26 NOTE — Discharge Instructions (Addendum)
 The urinalysis did not indicate an obvious urinary tract infection.  A urine culture has been ordered.  You will be contacted if the pending test results are abnormal.  You will also have access to the results via MyChart. Make sure you are drinking at least 8-10 8 ounce glasses of water daily. Make sure you are eating a healthy and well-balanced diet. I would like for you to call your obstetrician tomorrow to schedule an appointment for reevaluation as soon as possible. Go to the emergency department if you experience fever, chills, abdominal pain, abnormal vaginal bleeding, or other concerns. Follow-up as needed.

## 2024-09-26 NOTE — ED Triage Notes (Signed)
 Pt reports dysuria x1 week. Pt denies any known fevers. Reports is [redacted] weeks pregnant.

## 2024-09-28 ENCOUNTER — Ambulatory Visit (HOSPITAL_COMMUNITY): Payer: Self-pay

## 2024-09-28 LAB — URINE CULTURE

## 2024-10-22 ENCOUNTER — Ambulatory Visit: Payer: MEDICAID | Admitting: Women's Health

## 2024-10-22 ENCOUNTER — Ambulatory Visit: Payer: MEDICAID

## 2024-10-22 ENCOUNTER — Encounter: Payer: Self-pay | Admitting: Women's Health

## 2024-10-22 VITALS — BP 104/70 | HR 78 | Wt 142.0 lb

## 2024-10-22 DIAGNOSIS — Z3A20 20 weeks gestation of pregnancy: Secondary | ICD-10-CM

## 2024-10-22 DIAGNOSIS — Z3402 Encounter for supervision of normal first pregnancy, second trimester: Secondary | ICD-10-CM

## 2024-10-22 DIAGNOSIS — Z363 Encounter for antenatal screening for malformations: Secondary | ICD-10-CM

## 2024-10-22 NOTE — Patient Instructions (Signed)
 Jansen, thank you for choosing our office today! We appreciate the opportunity to meet your healthcare needs. You may receive a short survey by mail, e-mail, or through [employer]. If you are happy with your care we would appreciate if you could take just a few minutes to complete the survey questions. We read all of your comments and take your feedback very seriously. Thank you again for choosing our office.  Center for [employer] Team at [facility] at [facility] ([address] [place], [place] [address]) Entrance C, located off of [address] Free 24/7 valet parking  Go to [email or web address] to register for FREE online childbirth classes  Call the office [phone]) or go to [facility] if: You begin to severe cramping Your water breaks.  Sometimes it is a big gush of fluid, sometimes it is just a trickle that keeps getting your panties wet or running down your legs You have vaginal bleeding.  It is normal to have a small amount of spotting if your cervix was checked.   [facility] Pediatricians/Family Doctors [facility]): [address] [place] BROCKS, [phone]           [facility]: [address], [phone]                [facility]): [address], [phone] (call to ask if accepting patients) [facility] Department: [address], [place], [phone]    [facility] Pediatricians/Family Doctors [facility]): [phone] [address], [phone] [facility]: [address] [place], [phone] [facility]: [address], [phone]  [facility] Doctors  [facility]): [phone] [facility]: [address] [phone]   [facility] Doctors [facility]: [address] [phone]  [facility] Doctors  [facility]: [phone], [phone]  Home Blood Pressure Monitoring for Patients   Your provider has recommended that you check your blood pressure (BP) at least once a week at home. If you do not have a blood pressure cuff at home, one will be provided for you. Contact your provider if you have not received your monitor within 1 week.   Helpful Tips for Accurate Home Blood Pressure Checks  Don't smoke, exercise, or drink caffeine 30 minutes before checking your BP Use the restroom before checking your BP (a full bladder can raise your pressure) Relax in a comfortable upright chair Feet on the ground Left arm resting comfortably on a flat surface at the level of your heart Legs uncrossed Back supported Sit quietly and don't talk Place the cuff on your bare arm Adjust snuggly, so that only two fingertips can fit between your skin and the top of the cuff Check 2 readings separated by at least one minute Keep a log of your BP readings For a visual, please reference this diagram: http://ccnc.care/bpdiagram  Provider Name: [facility]     Phone: [phone]  Zone 1: ALL CLEAR  Continue to monitor your symptoms:  BP reading is less than 140 (top number) or less than 90 (bottom number)  No right upper stomach pain No headaches or seeing spots No feeling nauseated or throwing up No swelling in face and hands  Zone 2: CAUTION Call your doctor's office for any of the following:  BP reading is greater than 140 (top number) or greater than  90 (bottom number)  Stomach pain under your ribs in the middle or right side Headaches or seeing spots Feeling nauseated or throwing up Swelling in face and hands  Zone 3: EMERGENCY  Seek immediate medical care if you have any of the following:  BP reading is greater than160 (top number) or greater than 110 (bottom number) Severe headaches not improving with Tylenol  Serious difficulty catching your breath Any worsening symptoms from  Zone 2     Second Trimester of Pregnancy The second trimester is from week 14 through week 27 (months 4 through 6). The second trimester is often a time when you feel your best. Your body has adjusted to being pregnant, and you begin to feel better physically. Usually, morning sickness has lessened or quit completely, you may have more energy, and you may have an increase in appetite. The second trimester is also a time when the fetus is growing rapidly. At the end of the sixth month, the fetus is about 9 inches long and weighs about 1 pounds. You will likely begin to feel the baby move (quickening) between 16 and 20 weeks of pregnancy. Body changes during your second trimester Your body continues to go through many changes during your second trimester. The changes vary from woman to woman. Your weight will continue to increase. You will notice your lower abdomen bulging out. You may begin to get stretch marks on your hips, abdomen, and breasts. You may develop headaches that can be relieved by medicines. The medicines should be approved by your health care provider. You may urinate more often because the fetus is pressing on your bladder. You may develop or continue to have heartburn as a result of your pregnancy. You may develop constipation because certain hormones are causing the muscles that push waste through your intestines to slow down. You may develop hemorrhoids or swollen, bulging veins (varicose veins). You may have back pain. This is caused by: Weight gain. Pregnancy hormones that are relaxing the joints in your pelvis. A shift in weight and the muscles that support your balance. Your breasts will continue to grow and they will continue to become tender. Your gums may bleed and may be sensitive to brushing and flossing. Dark spots or blotches (chloasma, mask of pregnancy) may develop on your face. This will likely fade after the baby is born. A dark line from your belly button to  the pubic area (linea nigra) may appear. This will likely fade after the baby is born. You may have changes in your hair. These can include thickening of your hair, rapid growth, and changes in texture. Some women also have hair loss during or after pregnancy, or hair that feels dry or thin. Your hair will most likely return to normal after your baby is born.  What to expect at prenatal visits During a routine prenatal visit: You will be weighed to make sure you and the fetus are growing normally. Your blood pressure will be taken. Your abdomen will be measured to track your baby's growth. The fetal heartbeat will be listened to. Any test results from the previous visit will be discussed.  Your health care provider may ask you: How you are feeling. If you are feeling the baby move. If you have had any abnormal symptoms, such as leaking fluid, bleeding, severe headaches, or abdominal cramping. If you are using any tobacco products, including cigarettes, chewing tobacco, and electronic cigarettes. If you have any questions.  Other tests that may be performed during  your second trimester include: Blood tests that check for: Low iron levels (anemia). High blood sugar that affects pregnant women (gestational diabetes) between 94 and 28 weeks. Rh antibodies. This is to check for a protein on red blood cells (Rh factor). Urine tests to check for infections, diabetes, or protein in the urine. An ultrasound to confirm the proper growth and development of the baby. An amniocentesis to check for possible genetic problems. Fetal screens for spina bifida and Down syndrome. HIV (human immunodeficiency virus) testing. Routine prenatal testing includes screening for HIV, unless you choose not to have this test.  Follow these instructions at home: Medicines Follow your health care provider's instructions regarding medicine use. Specific medicines may be either safe or unsafe to take during  pregnancy. Take a prenatal vitamin that contains at least 600 micrograms (mcg) of folic acid. If you develop constipation, try taking a stool softener if your health care provider approves. Eating and drinking Eat a balanced diet that includes fresh fruits and vegetables, whole grains, good sources of protein such as meat, eggs, or tofu, and low-fat dairy. Your health care provider will help you determine the amount of weight gain that is right for you. Avoid raw meat and uncooked cheese. These carry germs that can cause birth defects in the baby. If you have low calcium intake from food, talk to your health care provider about whether you should take a daily calcium supplement. Limit foods that are high in fat and processed sugars, such as fried and sweet foods. To prevent constipation: Drink enough fluid to keep your urine clear or pale yellow. Eat foods that are high in fiber, such as fresh fruits and vegetables, whole grains, and beans. Activity Exercise only as directed by your health care provider. Most women can continue their usual exercise routine during pregnancy. Try to exercise for 30 minutes at least 5 days a week. Stop exercising if you experience uterine contractions. Avoid heavy lifting, wear low heel shoes, and practice good posture. A sexual relationship may be continued unless your health care provider directs you otherwise. Relieving pain and discomfort Wear a good support bra to prevent discomfort from breast tenderness. Take warm sitz baths to soothe any pain or discomfort caused by hemorrhoids. Use hemorrhoid cream if your health care provider approves. Rest with your legs elevated if you have leg cramps or low back pain. If you develop varicose veins, wear support hose. Elevate your feet for 15 minutes, 3-4 times a day. Limit salt in your diet. Prenatal Care Write down your questions. Take them to your prenatal visits. Keep all your prenatal visits as told by your health  care provider. This is important. Safety Wear your seat belt at all times when driving. Make a list of emergency phone numbers, including numbers for family, friends, the hospital, and police and fire departments. General instructions Ask your health care provider for a referral to a local prenatal education class. Begin classes no later than the beginning of month 6 of your pregnancy. Ask for help if you have counseling or nutritional needs during pregnancy. Your health care provider can offer advice or refer you to specialists for help with various needs. Do not use hot tubs, steam rooms, or saunas. Do not douche or use tampons or scented sanitary pads. Do not cross your legs for long periods of time. Avoid cat litter boxes and soil used by cats. These carry germs that can cause birth defects in the baby and possibly loss of the  fetus by miscarriage or stillbirth. Avoid all smoking, herbs, alcohol, and unprescribed drugs. Chemicals in these products can affect the formation and growth of the baby. Do not use any products that contain nicotine or tobacco, such as cigarettes and e-cigarettes. If you need help quitting, ask your health care provider. Visit your dentist if you have not gone yet during your pregnancy. Use a soft toothbrush to brush your teeth and be gentle when you floss. Contact a health care provider if: You have dizziness. You have mild pelvic cramps, pelvic pressure, or nagging pain in the abdominal area. You have persistent nausea, vomiting, or diarrhea. You have a bad smelling vaginal discharge. You have pain when you urinate. Get help right away if: You have a fever. You are leaking fluid from your vagina. You have spotting or bleeding from your vagina. You have severe abdominal cramping or pain. You have rapid weight gain or weight loss. You have shortness of breath with chest pain. You notice sudden or extreme swelling of your face, hands, ankles, feet, or legs. You  have not felt your baby move in over an hour. You have severe headaches that do not go away when you take medicine. You have vision changes. Summary The second trimester is from week 14 through week 27 (months 4 through 6). It is also a time when the fetus is growing rapidly. Your body goes through many changes during pregnancy. The changes vary from woman to woman. Avoid all smoking, herbs, alcohol, and unprescribed drugs. These chemicals affect the formation and growth your baby. Do not use any tobacco products, such as cigarettes, chewing tobacco, and e-cigarettes. If you need help quitting, ask your health care provider. Contact your health care provider if you have any questions. Keep all prenatal visits as told by your health care provider. This is important. This information is not intended to replace advice given to you by your health care provider. Make sure you discuss any questions you have with your health care provider. Document Released: 11/09/2001 Document Revised: 04/22/2016 Document Reviewed: 01/16/2013 Elsevier Interactive Patient Education  2017 [employer].

## 2024-10-22 NOTE — Progress Notes (Signed)
    LOW-RISK PREGNANCY VISIT Patient name: Amber Espinoza MRN [id number]  Date of birth: Nov 21, 2001 Chief Complaint:   Routine Prenatal Visit and Pregnancy Ultrasound  History of Present Illness:   Amber Espinoza is a 23 y.o. G1P0 female at [redacted]w[redacted]d with an Estimated Date of Delivery: 03/11/25 being seen today for ongoing management of a low-risk pregnancy.   Today she reports no complaints. Contractions: Not present.  .  Movement: Absent. denies leaking of fluid.     08/20/2024    3:08 PM  Depression screen PHQ 2/9  Decreased Interest 1  Down, Depressed, Hopeless 1  PHQ - 2 Score 2  Altered sleeping 2  Tired, decreased energy 3  Change in appetite 3  Feeling bad or failure about yourself  0  Trouble concentrating 0  Moving slowly or fidgety/restless 0  Suicidal thoughts 0  PHQ-9 Score 10      Data saved with a previous flowsheet row definition        08/20/2024    3:08 PM  GAD 7 : Generalized Anxiety Score  Nervous, Anxious, on Edge 0  Control/stop worrying 0  Worry too much - different things 0  Trouble relaxing 0  Restless 0  Easily annoyed or irritable 3  Afraid - awful might happen 0  Total GAD 7 Score 3      Review of Systems:   Pertinent items are noted in HPI Denies abnormal vaginal discharge w/ itching/odor/irritation, headaches, visual changes, shortness of breath, chest pain, abdominal pain, severe nausea/vomiting, or problems with urination or bowel movements unless otherwise stated above. Pertinent History Reviewed:  Reviewed past medical,surgical, social, obstetrical and family history.  Reviewed problem list, medications and allergies. Physical Assessment:   Vitals:   10/22/24 1613  BP: 104/70  Pulse: 78  Weight: 142 lb (64.4 kg)  Body mass index is 25.15 kg/m.        Physical Examination:   General appearance: Well appearing, and in no distress  Mental status: Alert, oriented to person, place, and time  Skin: Warm & dry  Cardiovascular: Normal heart  rate noted  Respiratory: Normal respiratory effort, no distress  Abdomen: Soft, gravid, nontender  Pelvic: Cervical exam deferred         Extremities: Edema: None  Fetal Status:     Movement: Absent    US  20 wks,cephalic,CX 3.3 cm,anterior placenta gr 0,normal ovaries,SVP of fluid 4.3 cm,FHR 151 bpm,EFW 296 g 21%,anatomy complete,no obvious abnormalities    Chaperone: N/A No results found for this or any previous visit (from the past 24 hours).  Assessment & Plan:  1) Low-risk pregnancy G1P0 at [redacted]w[redacted]d with an Estimated Date of Delivery: 03/11/25     Meds: No orders of the defined types were placed in this encounter.  Labs/procedures today: U/S  Plan:  Continue routine obstetrical care  Next visit: prefers in person    Reviewed: Preterm labor symptoms and general obstetric precautions including but not limited to vaginal bleeding, contractions, leaking of fluid and fetal movement were reviewed in detail with the patient.  All questions were answered. Does have home bp cuff. Office bp cuff given: not applicable. Check bp weekly, let us  know if consistently >140 and/or >90.  Follow-up: Return in about 4 weeks (around 11/19/2024) for LROB, CNM, in person.  No future appointments.  No orders of the defined types were placed in this encounter.  Suzen JONELLE Fetters CNM, [facility] [place] 10/22/2024 4:44 PM

## 2024-10-22 NOTE — Progress Notes (Addendum)
 US  20 wks,cephalic,CX 3.3 cm,anterior placenta gr 0,normal ovaries,SVP of fluid 4.3 cm,FHR 151 bpm,EFW 296 g 21%,anatomy complete,no obvious abnormalities

## 2024-11-19 ENCOUNTER — Encounter: Payer: Self-pay | Admitting: Women's Health

## 2024-11-19 ENCOUNTER — Encounter: Payer: MEDICAID | Admitting: Women's Health

## 2024-11-19 VITALS — BP 121/74 | HR 96 | Wt 143.0 lb

## 2024-11-19 DIAGNOSIS — Z3A24 24 weeks gestation of pregnancy: Secondary | ICD-10-CM

## 2024-11-19 DIAGNOSIS — Z3402 Encounter for supervision of normal first pregnancy, second trimester: Secondary | ICD-10-CM | POA: Diagnosis not present

## 2024-11-19 NOTE — Progress Notes (Signed)
" ° ° °  LOW-RISK PREGNANCY VISIT Patient name: Amber Espinoza MRN [id number]  Date of birth: July 29, 2001 Chief Complaint:   Routine Prenatal Visit  History of Present Illness:   Amber Espinoza is a 23 y.o. G1P0 female at [redacted]w[redacted]d with an Estimated Date of Delivery: 03/11/25 being seen today for ongoing management of a low-risk pregnancy.   Today she reports round ligament pain. Contractions: Not present. Vag. Bleeding: None.  Movement: Present. denies leaking of fluid.     08/20/2024    3:08 PM  Depression screen PHQ 2/9  Decreased Interest 1  Down, Depressed, Hopeless 1  PHQ - 2 Score 2  Altered sleeping 2  Tired, decreased energy 3  Change in appetite 3  Feeling bad or failure about yourself  0  Trouble concentrating 0  Moving slowly or fidgety/restless 0  Suicidal thoughts 0  PHQ-9 Score 10      Data saved with a previous flowsheet row definition        08/20/2024    3:08 PM  GAD 7 : Generalized Anxiety Score  Nervous, Anxious, on Edge 0  Control/stop worrying 0  Worry too much - different things 0  Trouble relaxing 0  Restless 0  Easily annoyed or irritable 3  Afraid - awful might happen 0  Total GAD 7 Score 3      Review of Systems:   Pertinent items are noted in HPI Denies abnormal vaginal discharge w/ itching/odor/irritation, headaches, visual changes, shortness of breath, chest pain, abdominal pain, severe nausea/vomiting, or problems with urination or bowel movements unless otherwise stated above. Pertinent History Reviewed:  Reviewed past medical,surgical, social, obstetrical and family history.  Reviewed problem list, medications and allergies. Physical Assessment:   Vitals:   11/19/24 1413  BP: 121/74  Pulse: 96  Weight: 143 lb (64.9 kg)  Body mass index is 25.33 kg/m.        Physical Examination:   General appearance: Well appearing, and in no distress  Mental status: Alert, oriented to person, place, and time  Skin: Warm & dry  Cardiovascular: Normal heart  rate noted  Respiratory: Normal respiratory effort, no distress  Abdomen: Soft, gravid, nontender  Pelvic: Cervical exam deferred         Extremities: Edema: Trace  Fetal Status: Fetal Heart Rate (bpm): 158 Fundal Height: 23 cm Movement: Present    Chaperone: N/A No results found for this or any previous visit (from the past 24 hours).  Assessment & Plan:  1) Low-risk pregnancy G1P0 at [redacted]w[redacted]d with an Estimated Date of Delivery: 03/11/25    Meds: No orders of the defined types were placed in this encounter.  Labs/procedures today: none  Plan:  Continue routine obstetrical care  Next visit: prefers will be in person for pn2    Reviewed: Preterm labor symptoms and general obstetric precautions including but not limited to vaginal bleeding, contractions, leaking of fluid and fetal movement were reviewed in detail with the patient.  All questions were answered. Does have home bp cuff. Office bp cuff given: not applicable. Check bp weekly, let us  know if consistently >140 and/or >90.  Follow-up: Return in about 4 weeks (around 12/17/2024) for LROB, PN2, CNM, in person.  No future appointments.  No orders of the defined types were placed in this encounter.  Suzen JONELLE Fetters CNM, [facility] 11/19/2024 2:27 PM  "

## 2024-11-19 NOTE — Patient Instructions (Signed)
 Abigayle, thank you for choosing our office today! We appreciate the opportunity to meet your healthcare needs. You may receive a short survey by mail, e-mail, or through [employer]. If you are happy with your care we would appreciate if you could take just a few minutes to complete the survey questions. We read all of your comments and take your feedback very seriously. Thank you again for choosing our office.  Center for [employer] Team at [facility]  [facility] at [facility] ([address] [place], [place] [address]) Entrance C, located off of [address] Free 24/7 valet parking   You will have your sugar test next visit.  Please do not eat or drink anything after midnight the night before you come, not even water.  You will be here for at least two hours.  Please make an appointment online for the bloodwork at [email or web address] for 8:00am (or as close to this as possible). Make sure you select the [facility] service center.   CLASSES: Go to [email or web address] to register for classes (childbirth, breastfeeding, waterbirth, infant CPR, daddy bootcamp, etc.)  Call the office [phone]) or go to [facility] if: You begin to have strong, frequent contractions Your water breaks.  Sometimes it is a big gush of fluid, sometimes it is just a trickle that keeps getting your panties wet or running down your legs You have vaginal bleeding.  It is normal to have a small amount of spotting if your cervix was checked.  You don't feel your baby moving like normal.  If you don't, get you something to eat and drink and lay down and focus on feeling your baby move.   If your baby is still not moving like normal, you should call the office or go to [facility].  Call the office [phone]) or go to [facility] for these signs of pre-eclampsia: Severe headache that does not go away with Tylenol  Visual changes- seeing spots, double, blurred vision Pain under your right breast or upper  abdomen that does not go away with Tums or heartburn medicine Nausea and/or vomiting Severe swelling in your hands, feet, and face    [place] Pediatricians/Family Doctors [facility]): [address] Luba BROCKS, [phone]           [facility]: [address], [phone]                [facility]): [address], [phone]  [facility] Department: [address], [place], [phone]    [facility] Pediatricians/Family Doctors [facility]): [address], [phone] [facility]: [address] [place], [phone] [facility]: [address], [phone]  [facility] Doctors  [facility]): [phone] [facility]: [address], [phone]   [facility] Doctors [facility]: [address] [phone]  [facility] Doctors  [facility]: [phone], [phone]  Home Blood Pressure Monitoring for Patients   Your provider has recommended that you check your blood pressure (BP) at least once a week at home. If you do not have a blood pressure cuff at home, one will be provided for you. Contact your provider if you have not received your monitor within 1 week.   Helpful Tips for Accurate Home Blood Pressure Checks  Don't smoke, exercise, or drink caffeine 30 minutes before checking  your BP Use the restroom before checking your BP (a full bladder can raise your pressure) Relax in a comfortable upright chair Feet on the ground Left arm resting comfortably on a flat surface at the level of your heart Legs uncrossed Back supported Sit quietly and don't talk Place the cuff on your bare arm Adjust snuggly, so that only two fingertips can fit between your skin and the top of the cuff Check 2 readings separated by at least one  minute Keep a log of your BP readings For a visual, please reference this diagram: http://ccnc.care/bpdiagram  Provider Name: [facility]     Phone: [phone]  Zone 1: ALL CLEAR  Continue to monitor your symptoms:  BP reading is less than 140 (top number) or less than 90 (bottom number)  No right upper stomach pain No headaches or seeing spots No feeling nauseated or throwing up No swelling in face and hands  Zone 2: CAUTION Call your doctor's office for any of the following:  BP reading is greater than 140 (top number) or greater than 90 (bottom number)  Stomach pain under your ribs in the middle or right side Headaches or seeing spots Feeling nauseated or throwing up Swelling in face and hands  Zone 3: EMERGENCY  Seek immediate medical care if you have any of the following:  BP reading is greater than160 (top number) or greater than 110 (bottom number) Severe headaches not improving with Tylenol  Serious difficulty catching your breath Any worsening symptoms from Zone 2   Second Trimester of Pregnancy The second trimester is from week 13 through week 28, months 4 through 6. The second trimester is often a time when you feel your best. Your body has also adjusted to being pregnant, and you begin to feel better physically. Usually, morning sickness has lessened or quit completely, you may have more energy, and you may have an increase in appetite. The second trimester is also a time when the fetus is growing rapidly. At the end of the sixth month, the fetus is about 9 inches long and weighs about 1 pounds. You will likely begin to feel the baby move (quickening) between 18 and 20 weeks of the pregnancy. BODY CHANGES Your body goes through many changes during pregnancy. The changes vary from woman to woman.  Your weight will continue to increase. You will notice your lower abdomen bulging out. You may begin to get stretch marks on your hips, abdomen, and breasts. You may  develop headaches that can be relieved by medicines approved by your health care provider. You may urinate more often because the fetus is pressing on your bladder. You may develop or continue to have heartburn as a result of your pregnancy. You may develop constipation because certain hormones are causing the muscles that push waste through your intestines to slow down. You may develop hemorrhoids or swollen, bulging veins (varicose veins). You may have back pain because of the weight gain and pregnancy hormones relaxing your joints between the bones in your pelvis and as a result of a shift in weight and the muscles that support your balance. Your breasts will continue to grow and be tender. Your gums may bleed and may be sensitive to brushing and flossing. Dark spots or blotches (chloasma, mask of pregnancy) may develop on your face. This will likely fade after the baby is born. A dark line from your belly button to the pubic area (linea nigra) may appear. This will likely fade after the  baby is born. You may have changes in your hair. These can include thickening of your hair, rapid growth, and changes in texture. Some women also have hair loss during or after pregnancy, or hair that feels dry or thin. Your hair will most likely return to normal after your baby is born. WHAT TO EXPECT AT YOUR PRENATAL VISITS During a routine prenatal visit: You will be weighed to make sure you and the fetus are growing normally. Your blood pressure will be taken. Your abdomen will be measured to track your baby's growth. The fetal heartbeat will be listened to. Any test results from the previous visit will be discussed. Your health care provider may ask you: How you are feeling. If you are feeling the baby move. If you have had any abnormal symptoms, such as leaking fluid, bleeding, severe headaches, or abdominal cramping. If you have any questions. Other tests that may be performed during your second  trimester include: Blood tests that check for: Low iron levels (anemia). Gestational diabetes (between 24 and 28 weeks). Rh antibodies. Urine tests to check for infections, diabetes, or protein in the urine. An ultrasound to confirm the proper growth and development of the baby. An amniocentesis to check for possible genetic problems. Fetal screens for spina bifida and Down syndrome. HOME CARE INSTRUCTIONS  Avoid all smoking, herbs, alcohol, and unprescribed drugs. These chemicals affect the formation and growth of the baby. Follow your health care provider's instructions regarding medicine use. There are medicines that are either safe or unsafe to take during pregnancy. Exercise only as directed by your health care provider. Experiencing uterine cramps is a good sign to stop exercising. Continue to eat regular, healthy meals. Wear a good support bra for breast tenderness. Do not use hot tubs, steam rooms, or saunas. Wear your seat belt at all times when driving. Avoid raw meat, uncooked cheese, cat litter boxes, and soil used by cats. These carry germs that can cause birth defects in the baby. Take your prenatal vitamins. Try taking a stool softener (if your health care provider approves) if you develop constipation. Eat more high-fiber foods, such as fresh vegetables or fruit and whole grains. Drink plenty of fluids to keep your urine clear or pale yellow. Take warm sitz baths to soothe any pain or discomfort caused by hemorrhoids. Use hemorrhoid cream if your health care provider approves. If you develop varicose veins, wear support hose. Elevate your feet for 15 minutes, 3-4 times a day. Limit salt in your diet. Avoid heavy lifting, wear low heel shoes, and practice good posture. Rest with your legs elevated if you have leg cramps or low back pain. Visit your dentist if you have not gone yet during your pregnancy. Use a soft toothbrush to brush your teeth and be gentle when you floss. A  sexual relationship may be continued unless your health care provider directs you otherwise. Continue to go to all your prenatal visits as directed by your health care provider. SEEK MEDICAL CARE IF:  You have dizziness. You have mild pelvic cramps, pelvic pressure, or nagging pain in the abdominal area. You have persistent nausea, vomiting, or diarrhea. You have a bad smelling vaginal discharge. You have pain with urination. SEEK IMMEDIATE MEDICAL CARE IF:  You have a fever. You are leaking fluid from your vagina. You have spotting or bleeding from your vagina. You have severe abdominal cramping or pain. You have rapid weight gain or loss. You have shortness of breath with chest pain. You  notice sudden or extreme swelling of your face, hands, ankles, feet, or legs. You have not felt your baby move in over an hour. You have severe headaches that do not go away with medicine. You have vision changes. Document Released: 11/09/2001 Document Revised: 11/20/2013 Document Reviewed: 01/16/2013 [facility] Patient Information 2015 [place], [place]. This information is not intended to replace advice given to you by your health care provider. Make sure you discuss any questions you have with your health care provider.

## 2024-12-17 ENCOUNTER — Ambulatory Visit: Payer: MEDICAID | Admitting: Women's Health

## 2024-12-17 ENCOUNTER — Encounter: Payer: Self-pay | Admitting: Women's Health

## 2024-12-17 ENCOUNTER — Other Ambulatory Visit: Payer: MEDICAID

## 2024-12-17 VITALS — BP 110/74 | HR 125 | Wt 148.0 lb

## 2024-12-17 DIAGNOSIS — Z1332 Encounter for screening for maternal depression: Secondary | ICD-10-CM | POA: Diagnosis not present

## 2024-12-17 DIAGNOSIS — Z3403 Encounter for supervision of normal first pregnancy, third trimester: Secondary | ICD-10-CM | POA: Diagnosis not present

## 2024-12-17 DIAGNOSIS — Z3402 Encounter for supervision of normal first pregnancy, second trimester: Secondary | ICD-10-CM

## 2024-12-17 DIAGNOSIS — Z3A28 28 weeks gestation of pregnancy: Secondary | ICD-10-CM | POA: Diagnosis not present

## 2024-12-17 DIAGNOSIS — Z131 Encounter for screening for diabetes mellitus: Secondary | ICD-10-CM

## 2024-12-17 NOTE — Progress Notes (Signed)
 "   LOW-RISK PREGNANCY VISIT Patient name: Amber Espinoza MRN [id number]  Date of birth: 12-26-2000 Chief Complaint:   Routine Prenatal Visit  History of Present Illness:   Amber Espinoza is a 24 y.o. G1P0 female at [redacted]w[redacted]d with an Estimated Date of Delivery: 03/11/25 being seen today for ongoing management of a low-risk pregnancy.   Today she reports no complaints. Contractions: Irritability. Vag. Bleeding: None.  Movement: Present. denies leaking of fluid.     12/17/2024   10:17 AM 08/20/2024    3:08 PM  Depression screen PHQ 2/9  Decreased Interest 1 1  Down, Depressed, Hopeless 0 1  PHQ - 2 Score 1 2  Altered sleeping 0 2  Tired, decreased energy 1 3  Change in appetite 0 3  Feeling bad or failure about yourself  0 0  Trouble concentrating  0  Moving slowly or fidgety/restless 0 0  Suicidal thoughts 0 0  PHQ-9 Score 2 10   Difficult doing work/chores Not difficult at all      Data saved with a previous flowsheet row definition        12/17/2024   10:19 AM 08/20/2024    3:08 PM  GAD 7 : Generalized Anxiety Score  Nervous, Anxious, on Edge 1 0  Control/stop worrying 0 0  Worry too much - different things 1 0  Trouble relaxing 0 0  Restless 0 0  Easily annoyed or irritable 2 3  Afraid - awful might happen 0 0  Total GAD 7 Score 4 3      Review of Systems:   Pertinent items are noted in HPI Denies abnormal vaginal discharge w/ itching/odor/irritation, headaches, visual changes, shortness of breath, chest pain, abdominal pain, severe nausea/vomiting, or problems with urination or bowel movements unless otherwise stated above. Pertinent History Reviewed:  Reviewed past medical,surgical, social, obstetrical and family history.  Reviewed problem list, medications and allergies. Physical Assessment:   Vitals:   12/17/24 1014  BP: 110/74  Pulse: (!) 125  Weight: 148 lb (67.1 kg)  Body mass index is 26.22 kg/m.        Physical Examination:   General appearance: Well  appearing, and in no distress  Mental status: Alert, oriented to person, place, and time  Skin: Warm & dry  Cardiovascular: Normal heart rate noted  Respiratory: Normal respiratory effort, no distress  Abdomen: Soft, gravid, nontender  Pelvic: Cervical exam deferred         Extremities:    Fetal Status: Fetal Heart Rate (bpm): 145 Fundal Height: 27 cm Movement: Present    Chaperone: N/A No results found for this or any previous visit (from the past 24 hours).  Assessment & Plan:  1) Low-risk pregnancy G1P0 at [redacted]w[redacted]d with an Estimated Date of Delivery: 03/11/25    Meds: No orders of the defined types were placed in this encounter.  Labs/procedures today: PN2 and declines tdap today, maybe next visit  Plan:  Continue routine obstetrical care  Next visit: prefers in person    Reviewed: Preterm labor symptoms and general obstetric precautions including but not limited to vaginal bleeding, contractions, leaking of fluid and fetal movement were reviewed in detail with the patient.  All questions were answered. Does have home bp cuff. Office bp cuff given: not applicable. Check bp weekly, let us  know if consistently >140 and/or >90.  Follow-up: Return in about 4 weeks (around 01/14/2025) for LROB, CNM, in person.  Future Appointments  Date Time Provider Department Center  01/14/2025  3:50 PM Kizzie Suzen SAUNDERS, CNM [facility]    No orders of the defined types were placed in this encounter.  Suzen SAUNDERS Kizzie CNM, [facility] 12/17/2024 10:38 AM  "

## 2024-12-17 NOTE — Patient Instructions (Signed)
 Amber Espinoza, thank you for choosing our office today! We appreciate the opportunity to meet your healthcare needs. You may receive a short survey by mail, e-mail, or through [employer]. If you are happy with your care we would appreciate if you could take just a few minutes to complete the survey questions. We read all of your comments and take your feedback very seriously. Thank you again for choosing our office.  Center for [employer] Team at [facility]  [facility] at [facility] ([address] [place], [place] [address]) Entrance C, located off of [address] Free 24/7 valet parking   CLASSES: Go to [email or web address] to register for classes (childbirth, breastfeeding, waterbirth, infant CPR, daddy bootcamp, etc.)  Call the office [phone]) or go to [facility] if: You begin to have strong, frequent contractions Your water breaks.  Sometimes it is a big gush of fluid, sometimes it is just a trickle that keeps getting your panties wet or running down your legs You have vaginal bleeding.  It is normal to have a small amount of spotting if your cervix was checked.  You don't feel your baby moving like normal.  If you don't, get you something to eat and drink and lay down and focus on feeling your baby move.   If your baby is still not moving like normal, you should call the office or go to [facility].  Call the office [phone]) or go to [facility] hospital for these signs of pre-eclampsia: Severe headache that does not go away with Tylenol  Visual changes- seeing spots, double, blurred vision Pain under your right breast or upper abdomen that does not go away with Tums or heartburn medicine Nausea and/or vomiting Severe swelling in your hands, feet, and face   Tdap Vaccine It is recommended that you get the Tdap vaccine during the third trimester of EACH pregnancy to help protect your baby from getting pertussis (whooping cough) 27-36 weeks is the BEST time to do  this so that you can pass the protection on to your baby. During pregnancy is better than after pregnancy, but if you are unable to get it during pregnancy it will be offered at the hospital.  You can get this vaccine with us , at the health department, your family doctor, or some local pharmacies Everyone who will be around your baby should also be up-to-date on their vaccines before the baby comes. Adults (who are not pregnant) only need 1 dose of Tdap during adulthood.   [facility] Pediatricians/Family Doctors [facility]): [address] Luba BROCKS, [phone]           [facility]: [address], [phone]                [facility]): [address], [phone] (call to ask if accepting patients) [facility] Department: [address] [place], [phone]    [facility] Pediatricians/Family Doctors [facility]): [phone], [phone] [facility]: [address] [place], [phone] [facility]: [address], [phone]  [facility] Doctors  [facility]): [phone] [facility]: [address] [phone]   [facility] Doctors [facility]: [address] [phone]  [facility] Family Doctors  [facility]: [phone], [phone]  Home Blood Pressure Monitoring for Patients   Your provider has recommended that you check your  blood pressure (BP) at least once a week at home. If you do not have a blood pressure cuff at home, one will be provided for you. Contact your provider if you have not received your monitor within 1 week.   Helpful Tips for Accurate Home Blood Pressure Checks  Don't smoke, exercise, or drink caffeine 30 minutes before checking your BP Use the restroom before checking your BP (a full bladder can raise your  pressure) Relax in a comfortable upright chair Feet on the ground Left arm resting comfortably on a flat surface at the level of your heart Legs uncrossed Back supported Sit quietly and don't talk Place the cuff on your bare arm Adjust snuggly, so that only two fingertips can fit between your skin and the top of the cuff Check 2 readings separated by at least one minute Keep a log of your BP readings For a visual, please reference this diagram: http://ccnc.care/bpdiagram  Provider Name: [facility]     Phone: [phone]  Zone 1: ALL CLEAR  Continue to monitor your symptoms:  BP reading is less than 140 (top number) or less than 90 (bottom number)  No right upper stomach pain No headaches or seeing spots No feeling nauseated or throwing up No swelling in face and hands  Zone 2: CAUTION Call your doctor's office for any of the following:  BP reading is greater than 140 (top number) or greater than 90 (bottom number)  Stomach pain under your ribs in the middle or right side Headaches or seeing spots Feeling nauseated or throwing up Swelling in face and hands  Zone 3: EMERGENCY  Seek immediate medical care if you have any of the following:  BP reading is greater than160 (top number) or greater than 110 (bottom number) Severe headaches not improving with Tylenol  Serious difficulty catching your breath Any worsening symptoms from Zone 2   Third Trimester of Pregnancy The third trimester is from week 29 through week 42, months 7 through 9. The third trimester is a time when the fetus is growing rapidly. At the end of the ninth month, the fetus is about 20 inches in length and weighs 6-10 pounds.  BODY CHANGES Your body goes through many changes during pregnancy. The changes vary from woman to woman.  Your weight will continue to increase. You can expect to gain 25-35 pounds (11-16 kg) by the end of the pregnancy. You may begin to get stretch marks on your hips, abdomen,  and breasts. You may urinate more often because the fetus is moving lower into your pelvis and pressing on your bladder. You may develop or continue to have heartburn as a result of your pregnancy. You may develop constipation because certain hormones are causing the muscles that push waste through your intestines to slow down. You may develop hemorrhoids or swollen, bulging veins (varicose veins). You may have pelvic pain because of the weight gain and pregnancy hormones relaxing your joints between the bones in your pelvis. Backaches may result from overexertion of the muscles supporting your posture. You may have changes in your hair. These can include thickening of your hair, rapid growth, and changes in texture. Some women also have hair loss during or after pregnancy, or hair that feels dry or thin. Your hair will most likely return to normal after your baby is born. Your breasts will continue to grow and be tender. A yellow discharge may leak from your breasts called colostrum. Your belly button may stick out. You may  feel short of breath because of your expanding uterus. You may notice the fetus dropping, or moving lower in your abdomen. You may have a bloody mucus discharge. This usually occurs a few days to a week before labor begins. Your cervix becomes thin and soft (effaced) near your due date. WHAT TO EXPECT AT YOUR PRENATAL EXAMS  You will have prenatal exams every 2 weeks until week 36. Then, you will have weekly prenatal exams. During a routine prenatal visit: You will be weighed to make sure you and the fetus are growing normally. Your blood pressure is taken. Your abdomen will be measured to track your baby's growth. The fetal heartbeat will be listened to. Any test results from the previous visit will be discussed. You may have a cervical check near your due date to see if you have effaced. At around 36 weeks, your caregiver will check your cervix. At the same time, your  caregiver will also perform a test on the secretions of the vaginal tissue. This test is to determine if a type of bacteria, Group B streptococcus, is present. Your caregiver will explain this further. Your caregiver may ask you: What your birth plan is. How you are feeling. If you are feeling the baby move. If you have had any abnormal symptoms, such as leaking fluid, bleeding, severe headaches, or abdominal cramping. If you have any questions. Other tests or screenings that may be performed during your third trimester include: Blood tests that check for low iron levels (anemia). Fetal testing to check the health, activity level, and growth of the fetus. Testing is done if you have certain medical conditions or if there are problems during the pregnancy. FALSE LABOR You may feel small, irregular contractions that eventually go away. These are called Braxton Hicks contractions, or false labor. Contractions may last for hours, days, or even weeks before true labor sets in. If contractions come at regular intervals, intensify, or become painful, it is best to be seen by your caregiver.  SIGNS OF LABOR  Menstrual-like cramps. Contractions that are 5 minutes apart or less. Contractions that start on the top of the uterus and spread down to the lower abdomen and back. A sense of increased pelvic pressure or back pain. A watery or bloody mucus discharge that comes from the vagina. If you have any of these signs before the 37th week of pregnancy, call your caregiver right away. You need to go to the hospital to get checked immediately. HOME CARE INSTRUCTIONS  Avoid all smoking, herbs, alcohol, and unprescribed drugs. These chemicals affect the formation and growth of the baby. Follow your caregiver's instructions regarding medicine use. There are medicines that are either safe or unsafe to take during pregnancy. Exercise only as directed by your caregiver. Experiencing uterine cramps is a good sign to  stop exercising. Continue to eat regular, healthy meals. Wear a good support bra for breast tenderness. Do not use hot tubs, steam rooms, or saunas. Wear your seat belt at all times when driving. Avoid raw meat, uncooked cheese, cat litter boxes, and soil used by cats. These carry germs that can cause birth defects in the baby. Take your prenatal vitamins. Try taking a stool softener (if your caregiver approves) if you develop constipation. Eat more high-fiber foods, such as fresh vegetables or fruit and whole grains. Drink plenty of fluids to keep your urine clear or pale yellow. Take warm sitz baths to soothe any pain or discomfort caused by hemorrhoids. Use hemorrhoid cream if  your caregiver approves. If you develop varicose veins, wear support hose. Elevate your feet for 15 minutes, 3-4 times a day. Limit salt in your diet. Avoid heavy lifting, wear low heal shoes, and practice good posture. Rest a lot with your legs elevated if you have leg cramps or low back pain. Visit your dentist if you have not gone during your pregnancy. Use a soft toothbrush to brush your teeth and be gentle when you floss. A sexual relationship may be continued unless your caregiver directs you otherwise. Do not travel far distances unless it is absolutely necessary and only with the approval of your caregiver. Take prenatal classes to understand, practice, and ask questions about the labor and delivery. Make a trial run to the hospital. Pack your hospital bag. Prepare the baby's nursery. Continue to go to all your prenatal visits as directed by your caregiver. SEEK MEDICAL CARE IF: You are unsure if you are in labor or if your water has broken. You have dizziness. You have mild pelvic cramps, pelvic pressure, or nagging pain in your abdominal area. You have persistent nausea, vomiting, or diarrhea. You have a bad smelling vaginal discharge. You have pain with urination. SEEK IMMEDIATE MEDICAL CARE IF:  You  have a fever. You are leaking fluid from your vagina. You have spotting or bleeding from your vagina. You have severe abdominal cramping or pain. You have rapid weight loss or gain. You have shortness of breath with chest pain. You notice sudden or extreme swelling of your face, hands, ankles, feet, or legs. You have not felt your baby move in over an hour. You have severe headaches that do not go away with medicine. You have vision changes. Document Released: 11/09/2001 Document Revised: 11/20/2013 Document Reviewed: 01/16/2013 [facility] Patient Information 2015 [place], [place]. This information is not intended to replace advice given to you by your health care provider. Make sure you discuss any questions you have with your health care provider.

## 2024-12-18 ENCOUNTER — Ambulatory Visit: Payer: Self-pay | Admitting: Women's Health

## 2024-12-18 LAB — HIV ANTIBODY (ROUTINE TESTING W REFLEX): HIV Screen 4th Generation wRfx: NONREACTIVE

## 2024-12-18 LAB — GLUCOSE TOLERANCE, 2 HOURS W/ 1HR
Glucose, 1 hour: 151 mg/dL (ref 70–179)
Glucose, 2 hour: 137 mg/dL (ref 70–152)
Glucose, Fasting: 65 mg/dL — ABNORMAL LOW (ref 70–91)

## 2024-12-18 LAB — CBC
Hematocrit: 34.5 % (ref 34.0–46.6)
Hemoglobin: 11.5 g/dL (ref 11.1–15.9)
MCH: 31.8 pg (ref 26.6–33.0)
MCHC: 33.3 g/dL (ref 31.5–35.7)
MCV: 95 fL (ref 79–97)
Platelets: 389 x10E3/uL (ref 150–450)
RBC: 3.62 x10E6/uL — ABNORMAL LOW (ref 3.77–5.28)
RDW: 13.1 % (ref 11.7–15.4)
WBC: 7 x10E3/uL (ref 3.4–10.8)

## 2024-12-18 LAB — ANTIBODY SCREEN: Antibody Screen: NEGATIVE

## 2024-12-18 LAB — SYPHILIS: RPR W/REFLEX TO RPR TITER AND TREPONEMAL ANTIBODIES, TRADITIONAL SCREENING AND DIAGNOSIS ALGORITHM: RPR Ser Ql: NONREACTIVE

## 2025-01-04 ENCOUNTER — Encounter: Payer: Self-pay | Admitting: Women's Health

## 2025-01-04 ENCOUNTER — Telehealth: Payer: Self-pay | Admitting: Adult Health

## 2025-01-04 NOTE — Telephone Encounter (Signed)
 LMOVM for patient to return call regarding additional questions for letter.

## 2025-01-04 NOTE — Telephone Encounter (Signed)
 Patient's employer needs a letter stating her lifting restrictions. Please advise.

## 2025-01-14 ENCOUNTER — Encounter: Payer: MEDICAID | Admitting: Women's Health
